# Patient Record
Sex: Female | Born: 1975 | Race: White | Hispanic: No | Marital: Married | Smoking: Never smoker
Health system: Southern US, Community
[De-identification: ages and names within clinical notes are randomized; demographics above are authoritative.]

---

## 2004-07-25 ENCOUNTER — Ambulatory Visit: Payer: Self-pay | Admitting: Family Medicine

## 2007-02-26 ENCOUNTER — Ambulatory Visit (HOSPITAL_COMMUNITY): Admission: RE | Admit: 2007-02-26 | Discharge: 2007-02-26 | Payer: Self-pay | Admitting: Obstetrics and Gynecology

## 2007-02-26 IMAGING — US US OB NUCHAL TRANSLUCENCY 1ST GEST
1 series · 14 of 28 positions shown · non-contrast
Comparison: none

OBSTETRICAL ULTRASOUND:
 This ultrasound was performed in The [HOSPITAL], and the [facility] report will be stored to [REDACTED] PACS.

[Series 1: us ob nuchal translucency 1st gest · 14 of 31 slices shown]
[im 2/31]
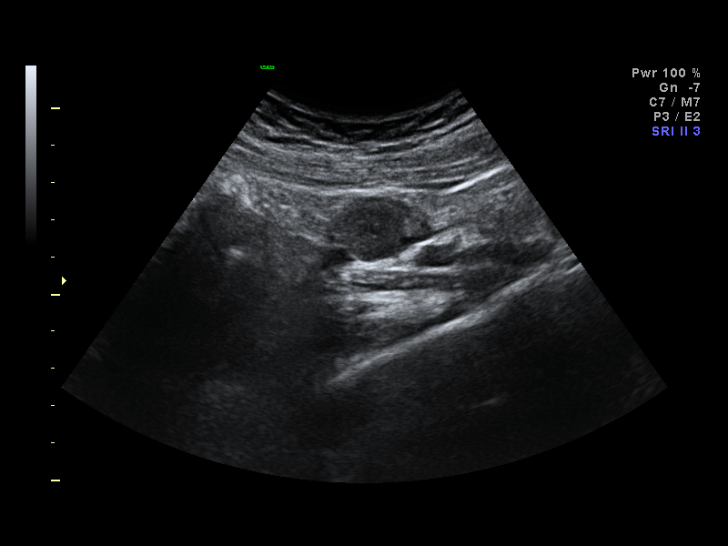
[im 4/31]
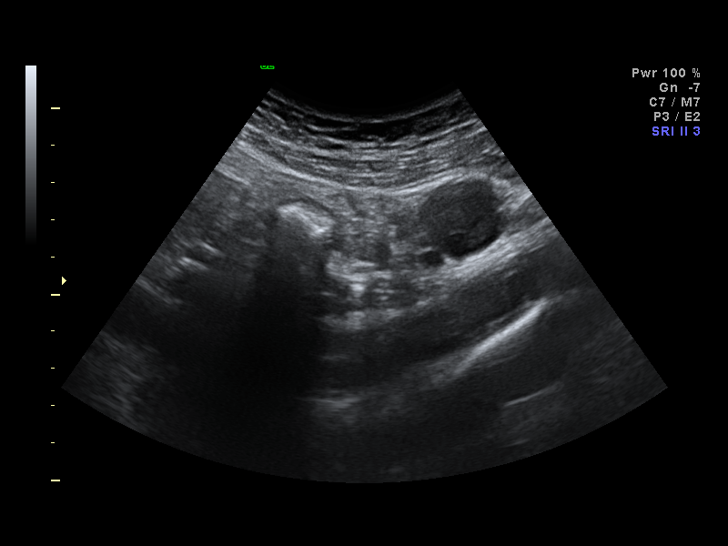
[im 6/31]
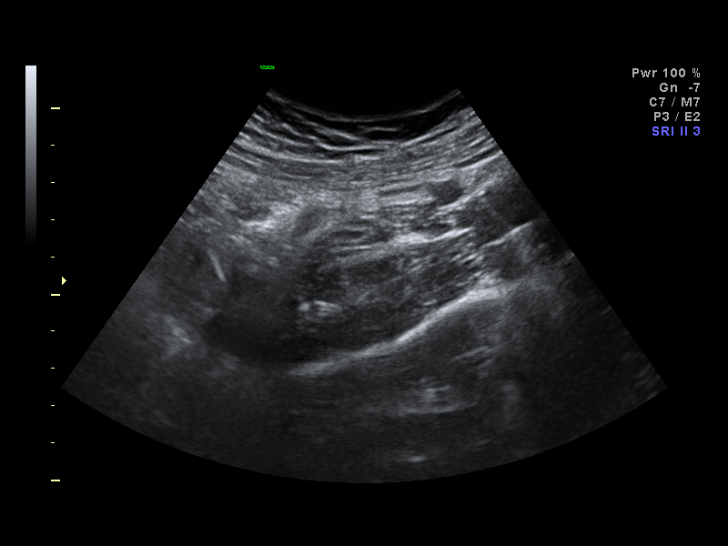
[im 8/31]
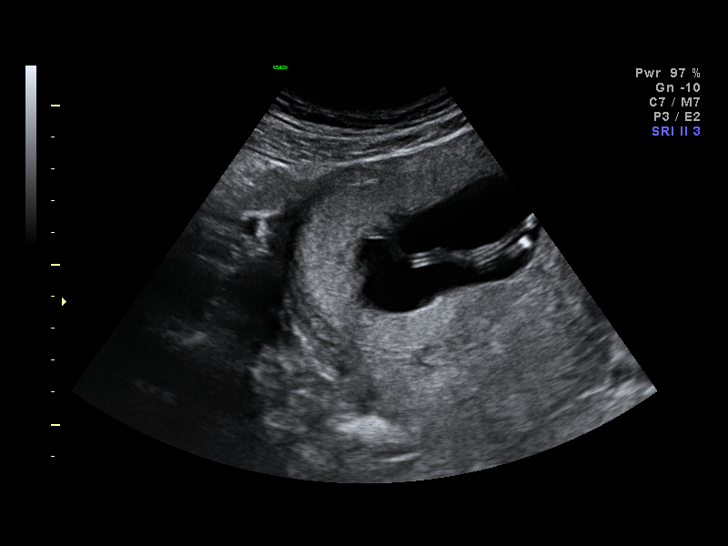
[im 11/31]
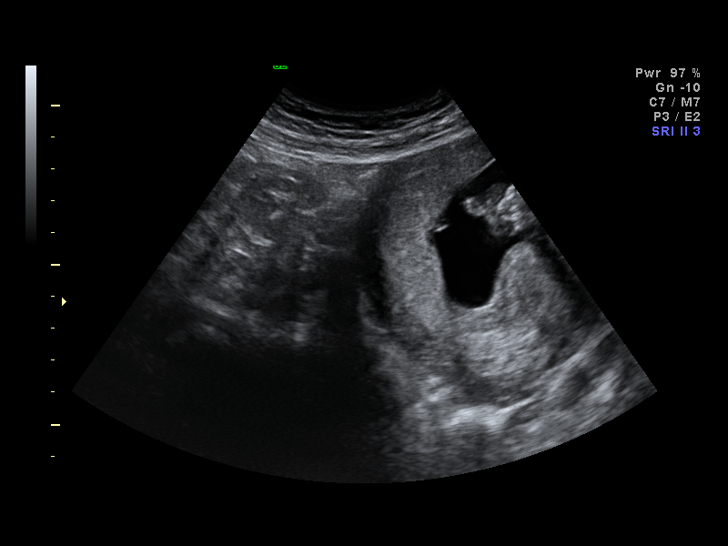
[im 13/31]
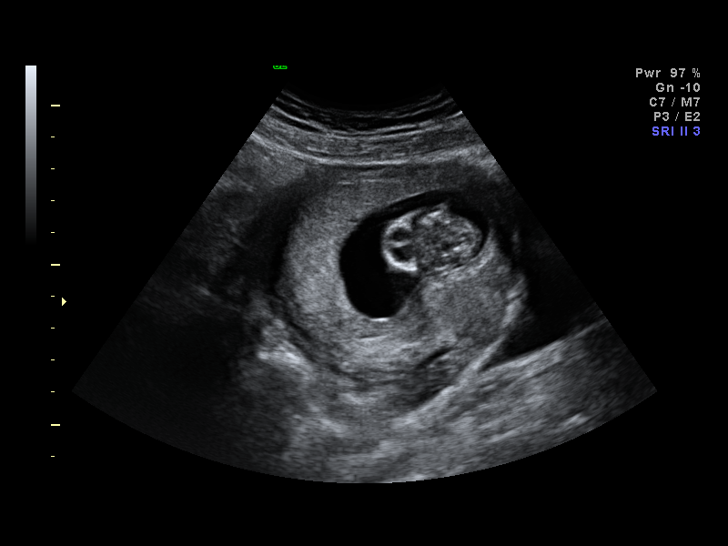
[im 15/31]
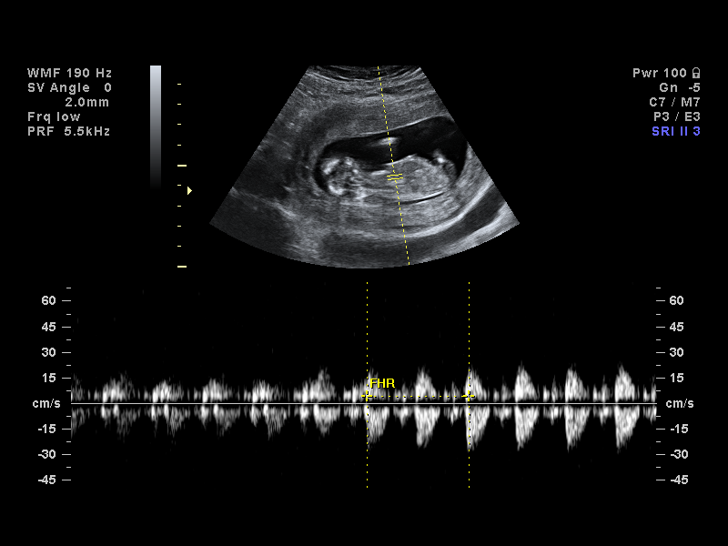
[im 17/31]
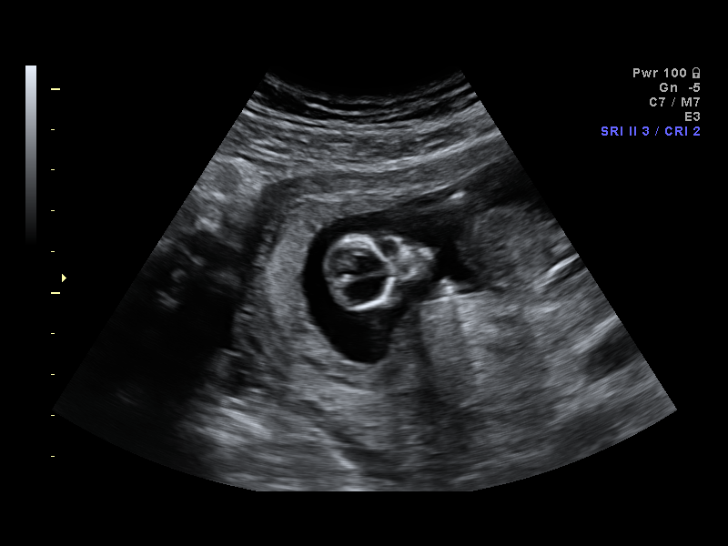
[im 19/31]
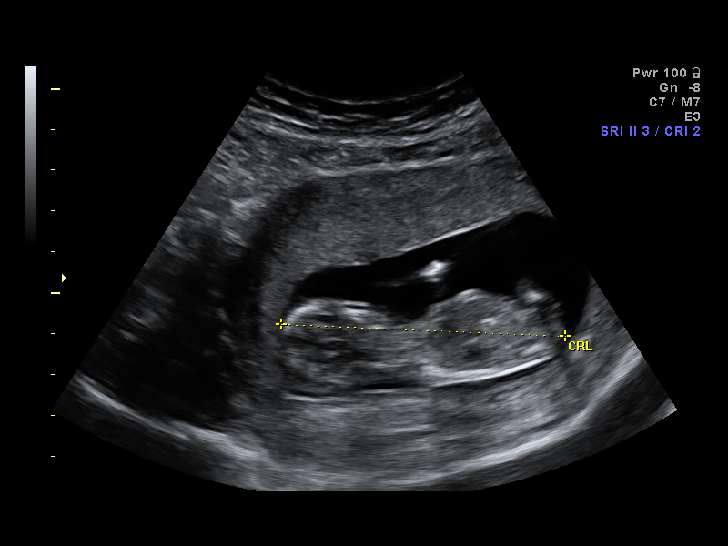
[im 22/31]
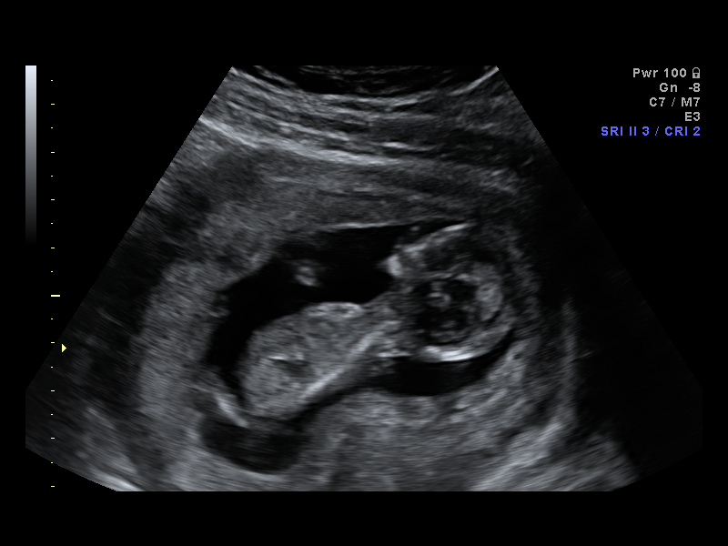
[im 24/31]
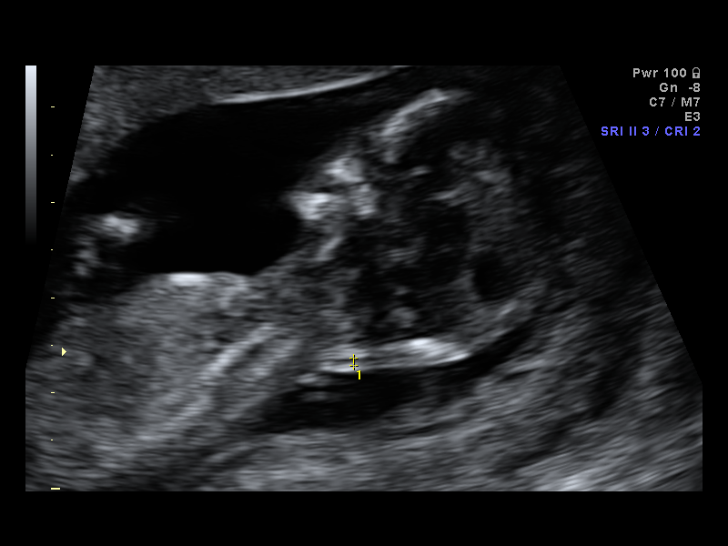
[im 26/31]
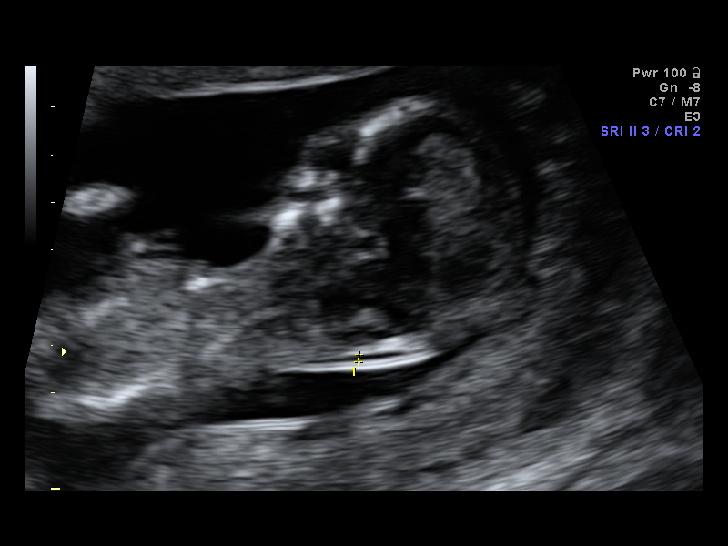
[im 28/31]
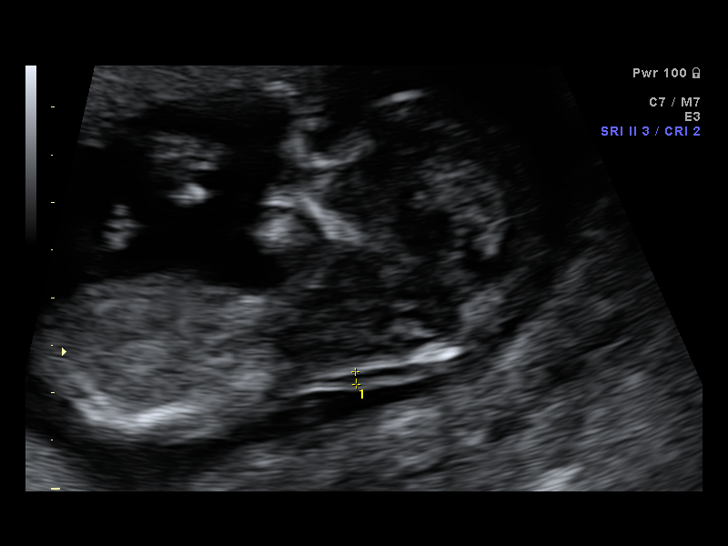
[im 31/31]
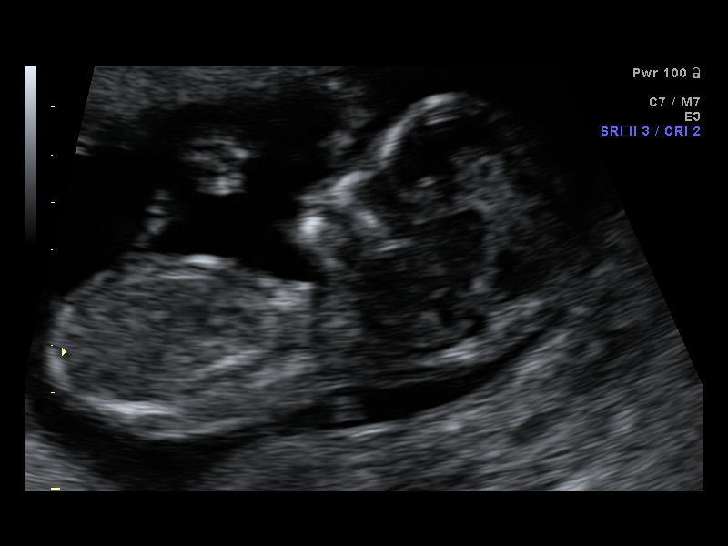

[14 of 28 positions shown; findings below may reference images not displayed]

IMPRESSION: The [facility] report has also been faxed to the ordering physician.

## 2007-03-26 ENCOUNTER — Ambulatory Visit (HOSPITAL_COMMUNITY): Admission: RE | Admit: 2007-03-26 | Discharge: 2007-03-26 | Payer: Self-pay | Admitting: Obstetrics and Gynecology

## 2019-09-16 ENCOUNTER — Other Ambulatory Visit: Payer: Self-pay | Admitting: Obstetrics and Gynecology

## 2019-09-16 DIAGNOSIS — N6489 Other specified disorders of breast: Secondary | ICD-10-CM

## 2019-09-29 ENCOUNTER — Other Ambulatory Visit: Payer: Self-pay | Admitting: Obstetrics and Gynecology

## 2019-09-29 ENCOUNTER — Ambulatory Visit
Admission: RE | Admit: 2019-09-29 | Discharge: 2019-09-29 | Disposition: A | Discharge status: Home / Self Care | Payer: BC Managed Care – PPO | Source: Ambulatory Visit | Attending: Obstetrics and Gynecology | Admitting: Obstetrics and Gynecology

## 2019-09-29 ENCOUNTER — Other Ambulatory Visit: Payer: Self-pay

## 2019-09-29 DIAGNOSIS — N6489 Other specified disorders of breast: Secondary | ICD-10-CM

## 2019-09-29 IMAGING — MG MM DIGITAL DIAGNOSTIC UNILAT*R* W/ TOMO W/ CAD
4 series · 4 of 12 positions shown · non-contrast
Comparison: [DATE] and earlier priors

CLINICAL DATA: 44-year-old patient recalled recent screening
mammogram for evaluation of a possible asymmetry in the inferior
right breast. Patient's mother was diagnosed with breast cancer in
her 40s.

EXAM:
DIGITAL DIAGNOSTIC RIGHT MAMMOGRAM WITH CAD AND TOMO
ULTRASOUND RIGHT BREAST

[R MLO synth-2D]
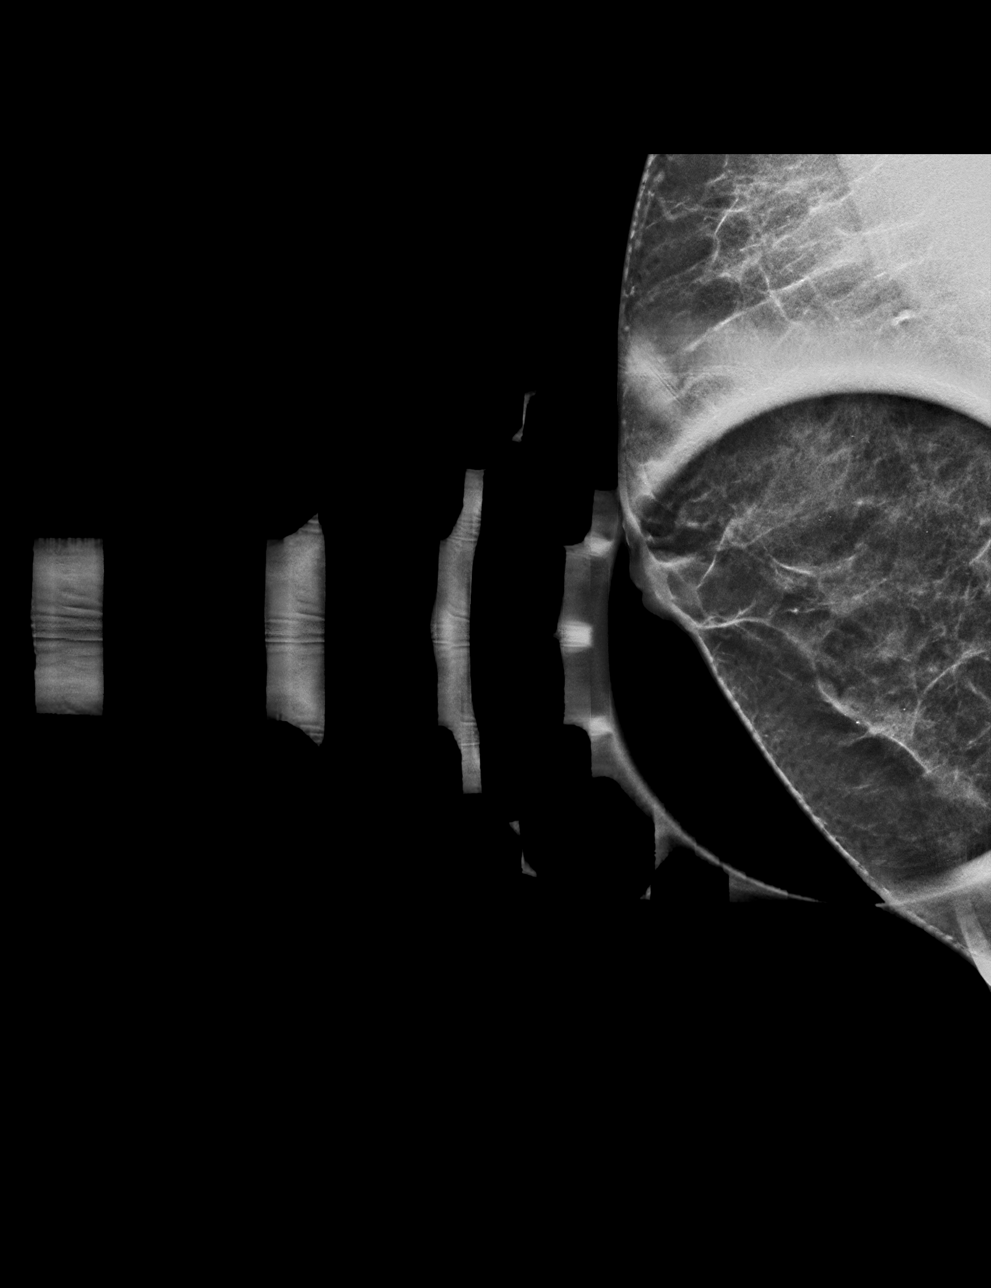

[R ML synth-2D]
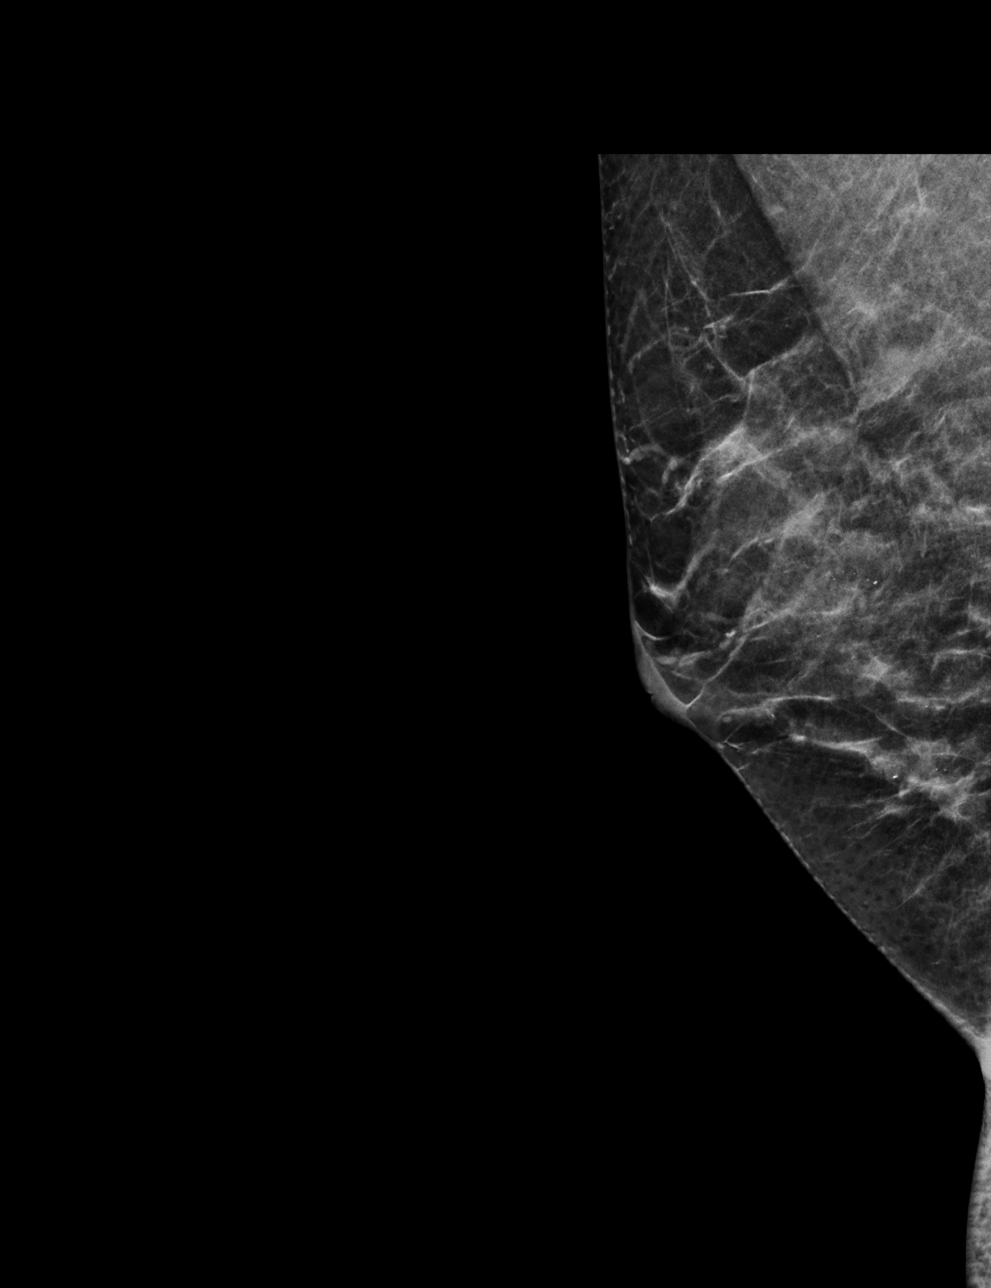

[R ML tomo · tomo slice 22/43.0]
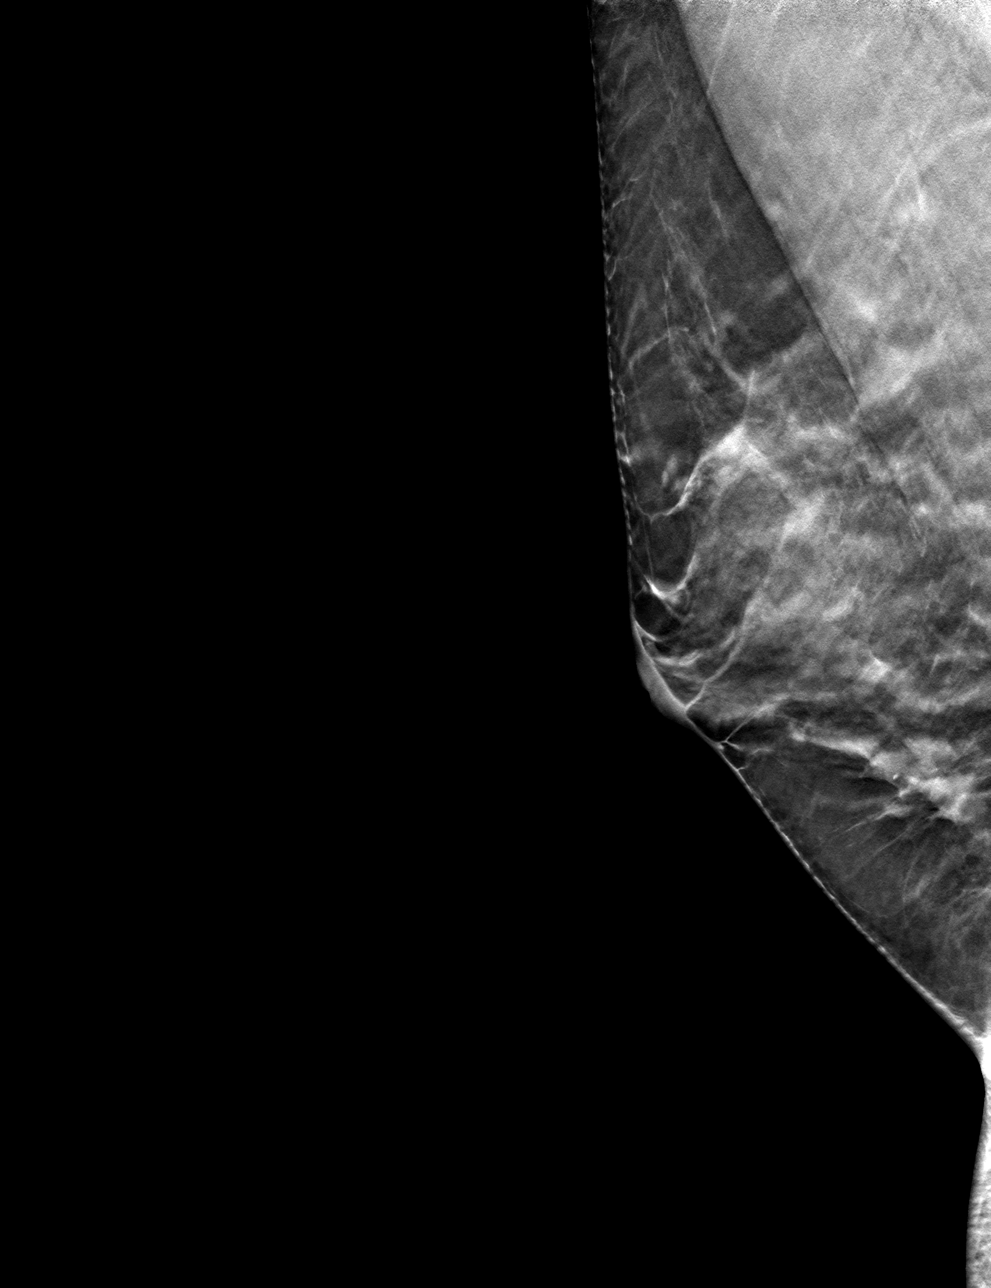

[R MLO tomo · tomo slice 22/43.0]
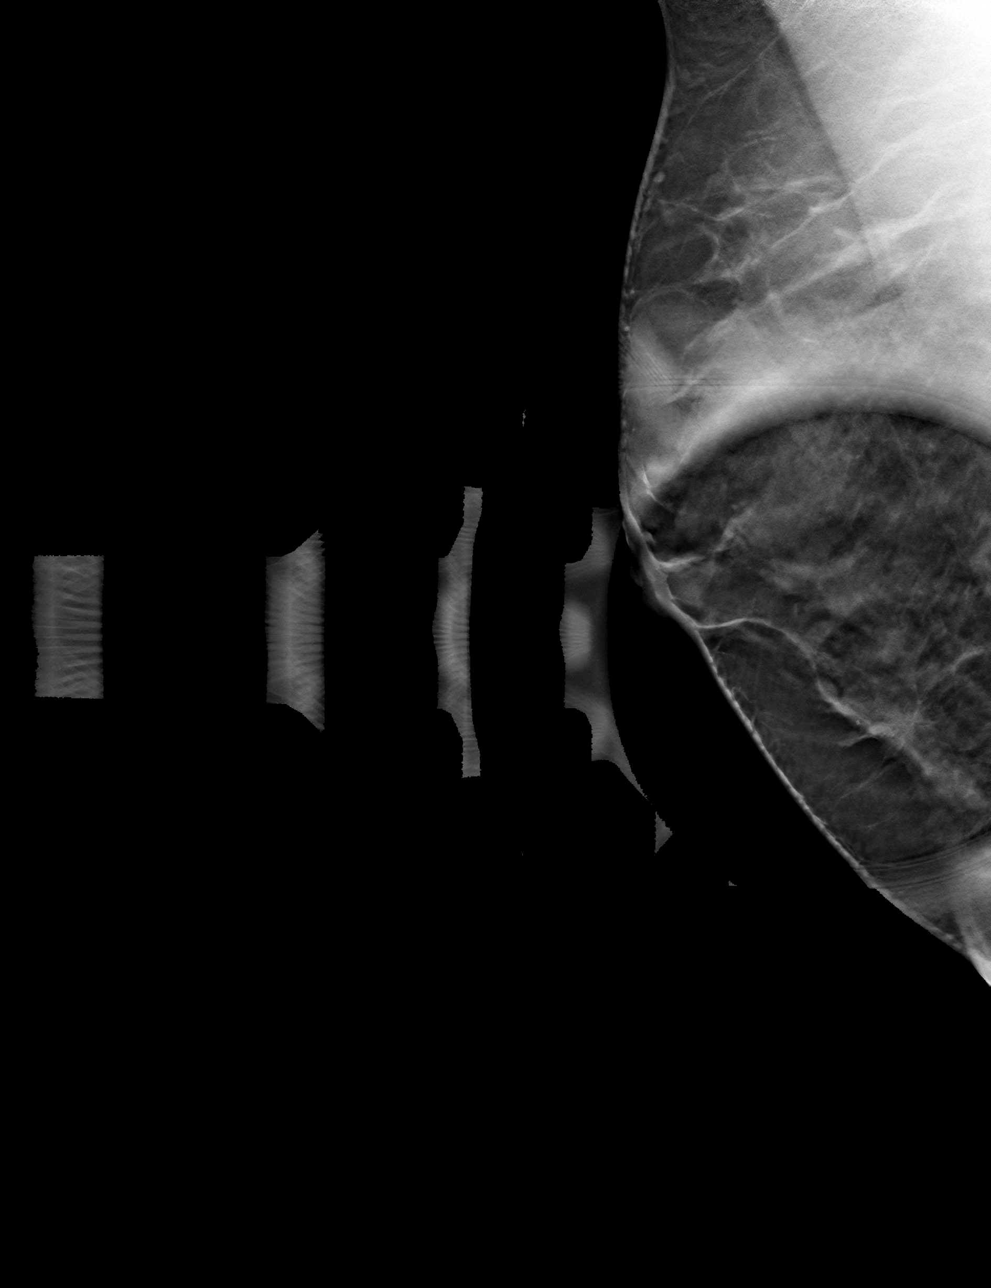

[4 of 12 positions shown; findings below may reference images not displayed]

ACR Breast Density Category c: The breast tissue is heterogeneously
dense, which may obscure small masses.
FINDINGS: Compression views with tomography confirm a persistent asymmetry
with a few associated calcifications in the inferior right breast.
When correlated with the CC view of the recent screening mammogram,
this is thought to be slightly medial in position. 90 degree lateral
view of the right breast shows a persistent asymmetry with
calcifications slightly medial to midline.

Mammographic images were processed with CAD.

On physical exam, no mass is palpated in the lower inner quadrant of
the right breast.

Targeted ultrasound is performed, showing mixed echogenicity mass
with internal cystic spaces, hypoechoic tissue and focal echogenic
areas suggestive of calcifications. This is at 5 o'clock position 1
cm from the nipple and in total measures 2.1 x 0.7 x 1.5 cm.

Ultrasound of the right axilla is negative for lymphadenopathy.
IMPRESSION: 2.1 cm indeterminate mass in the 5 o'clock position of the right
breast. Complex sclerosing lesion or malignancy cannot be completely
excluded.

RECOMMENDATION:
Ultrasound-guided core needle biopsy of the right breast mass is
recommended. Biopsy is being scheduled for the patient.

I have discussed the findings and recommendations with the patient.
If applicable, a reminder letter will be sent to the patient
regarding the next appointment.

BI-RADS CATEGORY  4: Suspicious.

## 2019-09-29 IMAGING — US US BREAST*R* LIMITED INC AXILLA
1 series · 10 of 10 positions shown · non-contrast
Comparison: [DATE] and earlier priors

CLINICAL DATA: 44-year-old patient recalled recent screening
mammogram for evaluation of a possible asymmetry in the inferior
right breast. Patient's mother was diagnosed with breast cancer in
her 40s.

EXAM:
DIGITAL DIAGNOSTIC RIGHT MAMMOGRAM WITH CAD AND TOMO
ULTRASOUND RIGHT BREAST

[Series 2: us breast*right* limited inc axilla · 0.06mm/px · 10 of 10 slices shown]
[im 1/10]
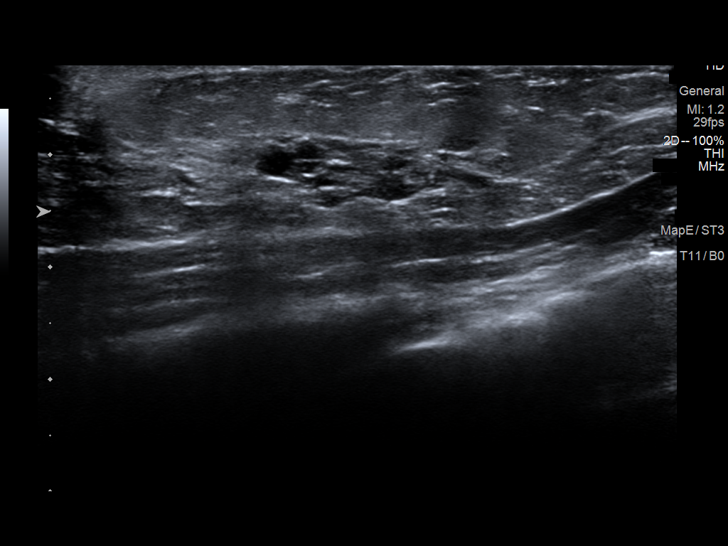
[im 2/10]
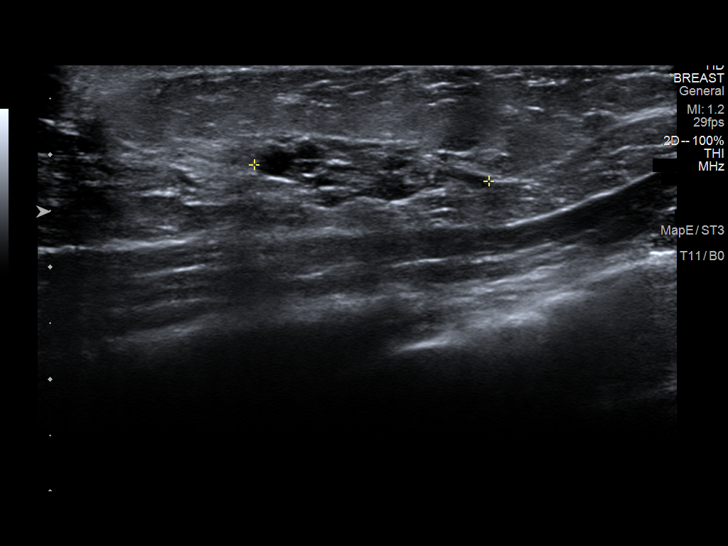
[im 3/10]
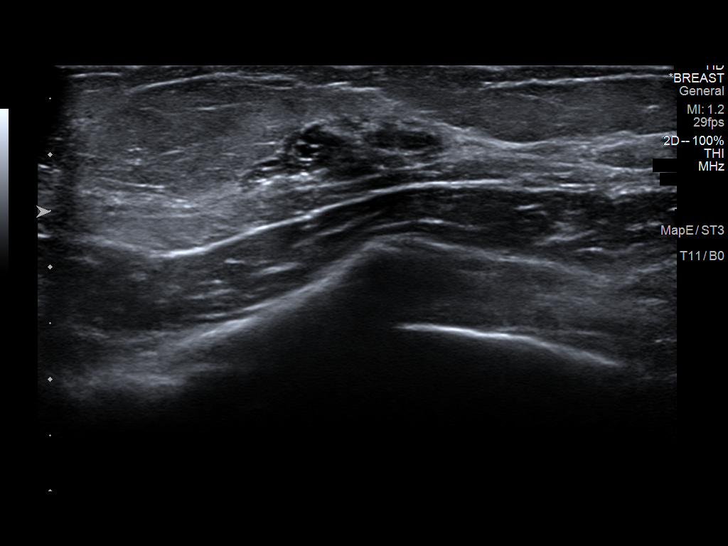
[im 4/10]
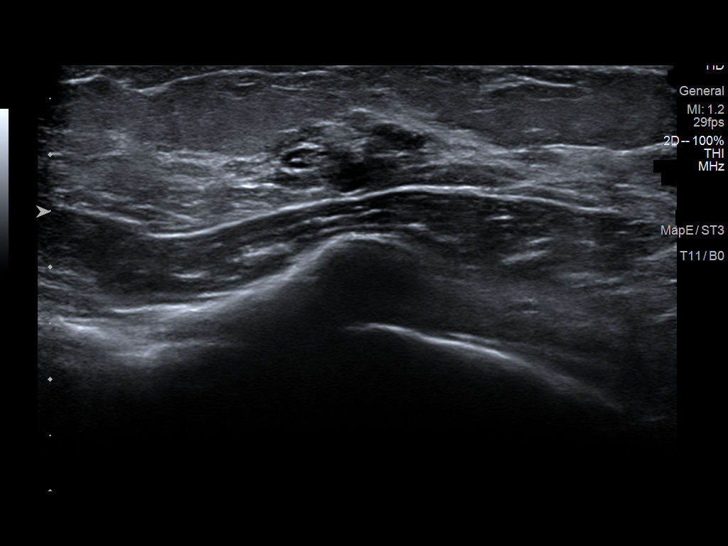
[im 5/10]
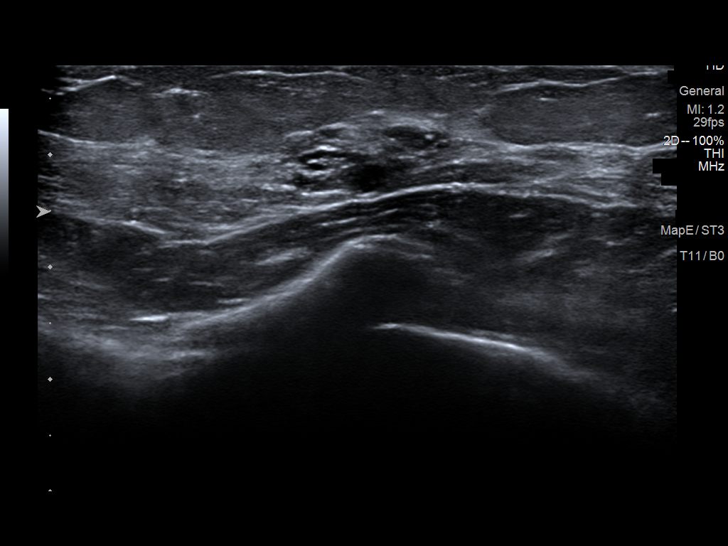
[im 6/10]
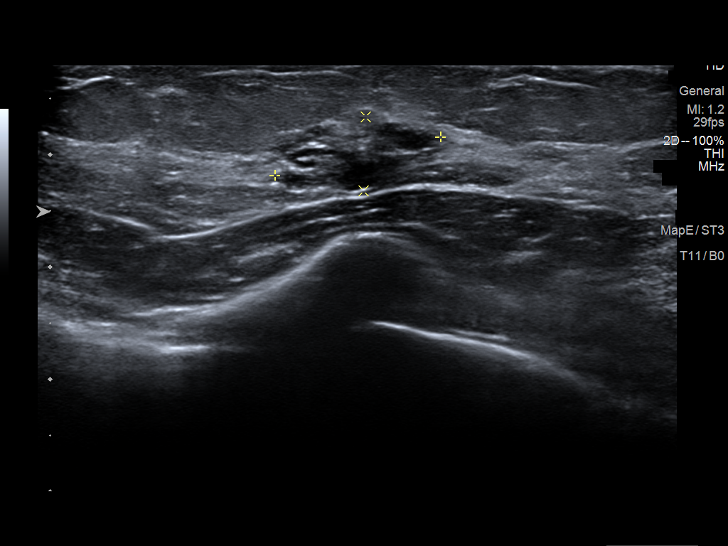
[im 7/10]
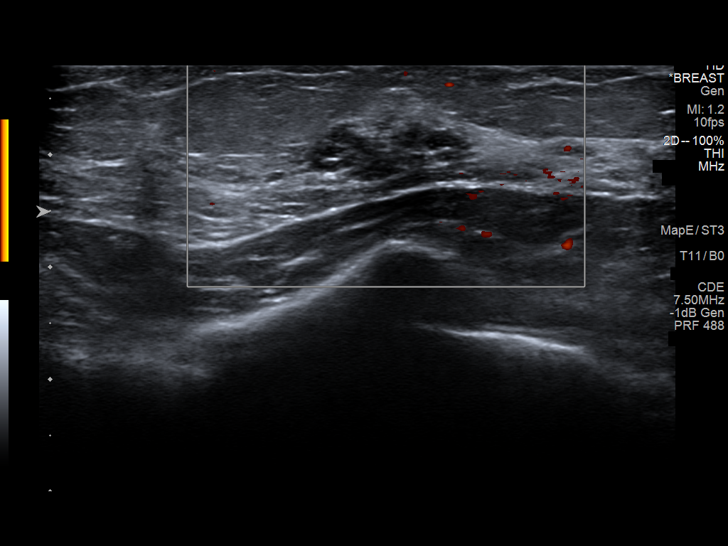
[im 8/10]
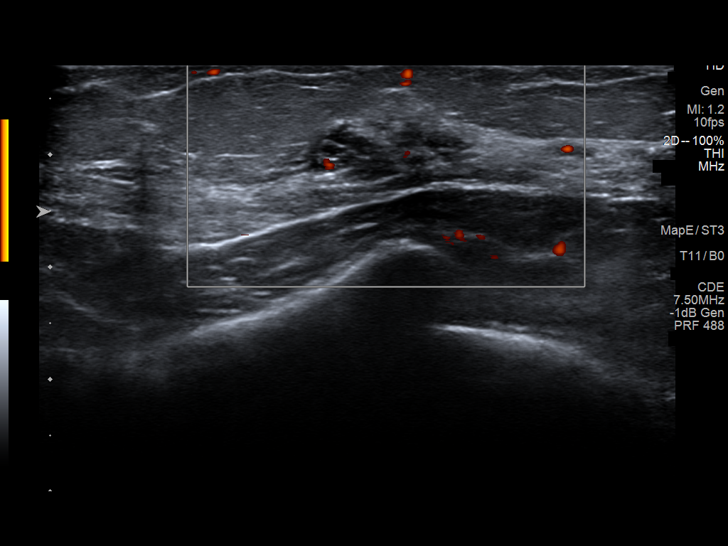
[im 9/10]
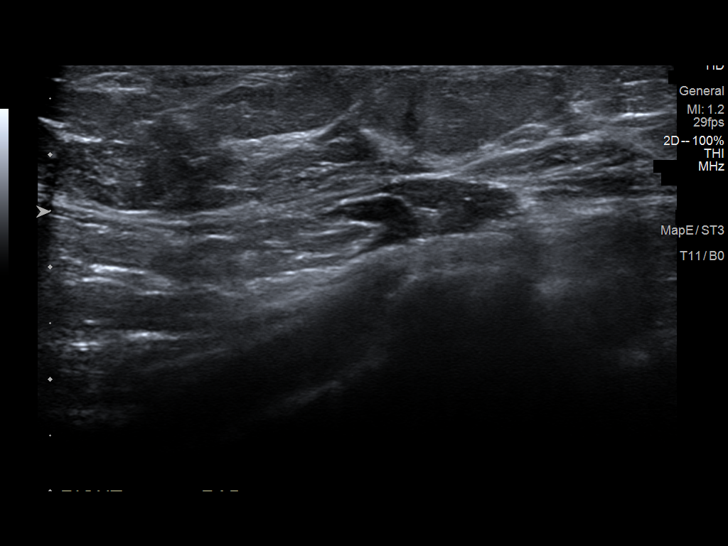
[im 10/10]
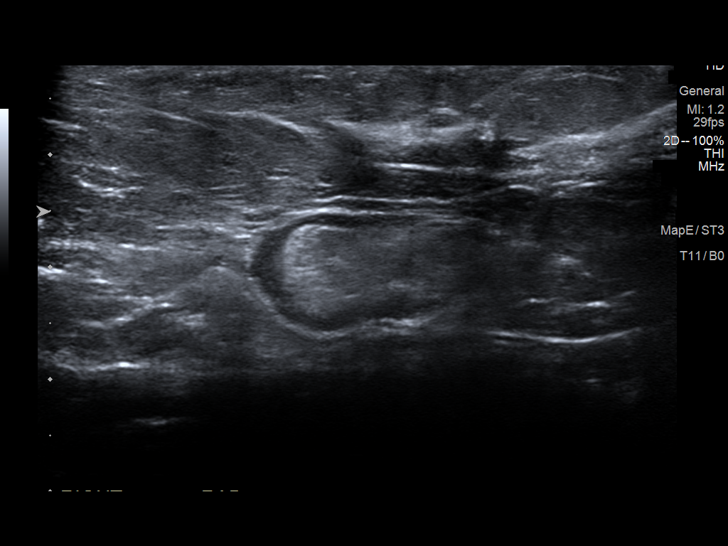

[10 of 10 positions shown; findings below may reference images not displayed]

ACR Breast Density Category c: The breast tissue is heterogeneously
dense, which may obscure small masses.
FINDINGS: Compression views with tomography confirm a persistent asymmetry
with a few associated calcifications in the inferior right breast.
When correlated with the CC view of the recent screening mammogram,
this is thought to be slightly medial in position. 90 degree lateral
view of the right breast shows a persistent asymmetry with
calcifications slightly medial to midline.

Mammographic images were processed with CAD.

On physical exam, no mass is palpated in the lower inner quadrant of
the right breast.

Targeted ultrasound is performed, showing mixed echogenicity mass
with internal cystic spaces, hypoechoic tissue and focal echogenic
areas suggestive of calcifications. This is at 5 o'clock position 1
cm from the nipple and in total measures 2.1 x 0.7 x 1.5 cm.

Ultrasound of the right axilla is negative for lymphadenopathy.
IMPRESSION: 2.1 cm indeterminate mass in the 5 o'clock position of the right
breast. Complex sclerosing lesion or malignancy cannot be completely
excluded.

RECOMMENDATION:
Ultrasound-guided core needle biopsy of the right breast mass is
recommended. Biopsy is being scheduled for the patient.

I have discussed the findings and recommendations with the patient.
If applicable, a reminder letter will be sent to the patient
regarding the next appointment.

BI-RADS CATEGORY  4: Suspicious.

## 2019-10-04 ENCOUNTER — Other Ambulatory Visit: Payer: Self-pay | Admitting: Obstetrics and Gynecology

## 2019-10-04 DIAGNOSIS — N6489 Other specified disorders of breast: Secondary | ICD-10-CM

## 2019-10-10 ENCOUNTER — Other Ambulatory Visit: Payer: Self-pay

## 2019-10-10 ENCOUNTER — Ambulatory Visit
Admission: RE | Admit: 2019-10-10 | Discharge: 2019-10-10 | Disposition: A | Discharge status: Home / Self Care | Payer: BC Managed Care – PPO | Source: Ambulatory Visit | Attending: Obstetrics and Gynecology | Admitting: Obstetrics and Gynecology

## 2019-10-10 DIAGNOSIS — N6489 Other specified disorders of breast: Secondary | ICD-10-CM

## 2019-10-10 IMAGING — US US  BREAST BX W/ LOC DEV 1ST LESION IMG BX SPEC US GUIDE*R*
1 series · 12 of 13 positions shown · non-contrast
Comparison: Previous exam(s).
COMPARISON: Previous exam(s).

Addendum:
CLINICAL DATA: 44-year-old with a screening detected asymmetry
involving the RIGHT breast, shown on diagnostic ultrasound to have
an indeterminate 2.1 cm heterogeneous mass with cystic spaces and
calcifications in the LOWER INNER QUADRANT at 5 o'clock position
approximately 1 cm nipple.

EXAM:
ULTRASOUND GUIDED RIGHT BREAST CORE NEEDLE BIOPSY

[Series 1: us breast bx w/ loc dev 1st lesion img bx spec us  · 0.06mm/px · 12 of 13 slices shown]
[im 1/13]
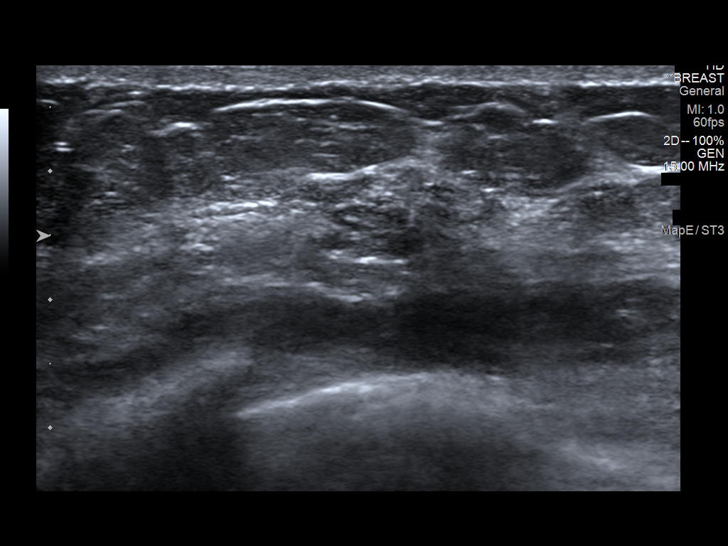
[im 2/13]
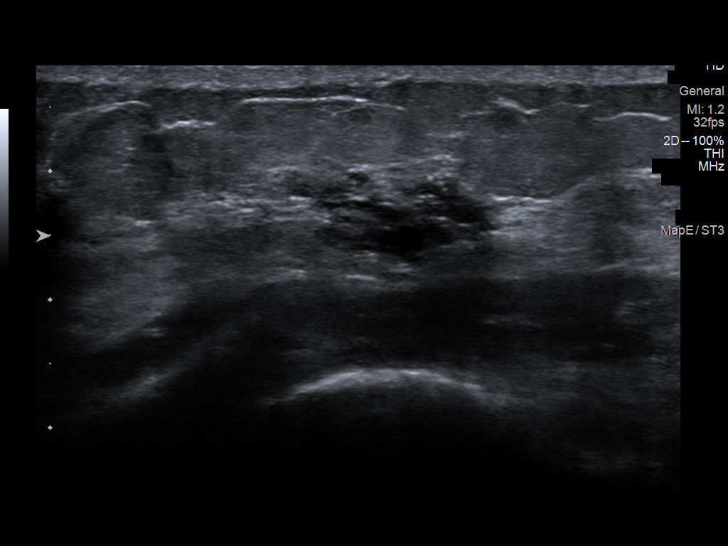
[im 3/13]
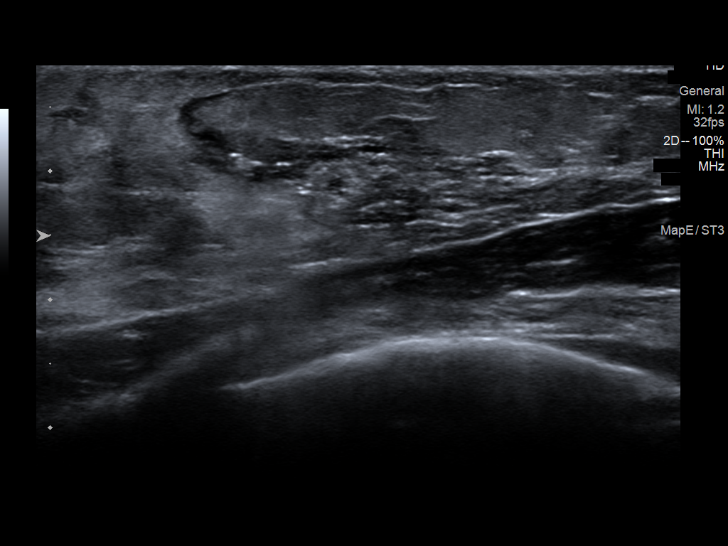
[im 4/13]
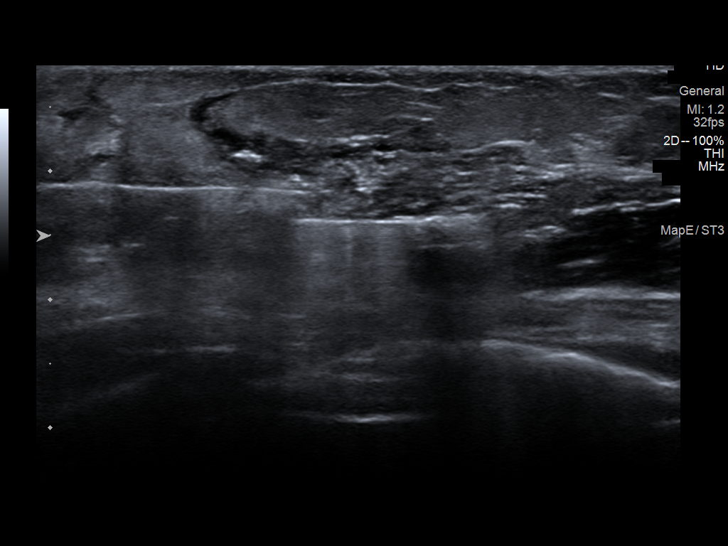
[im 5/13]
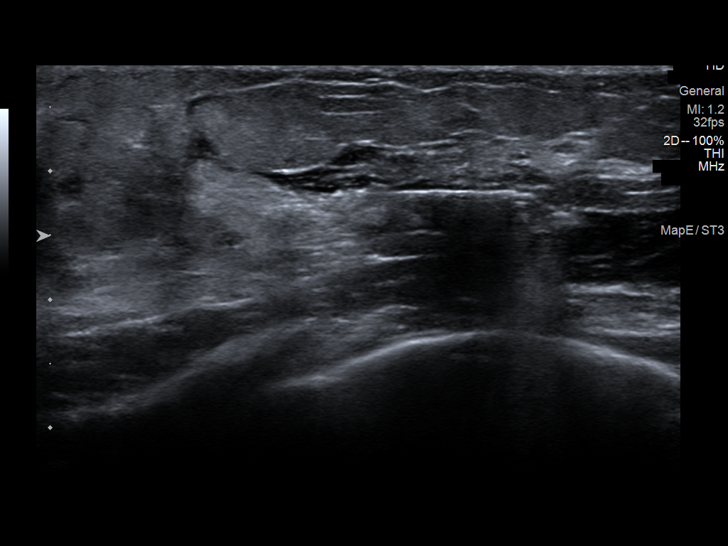
[im 6/13]
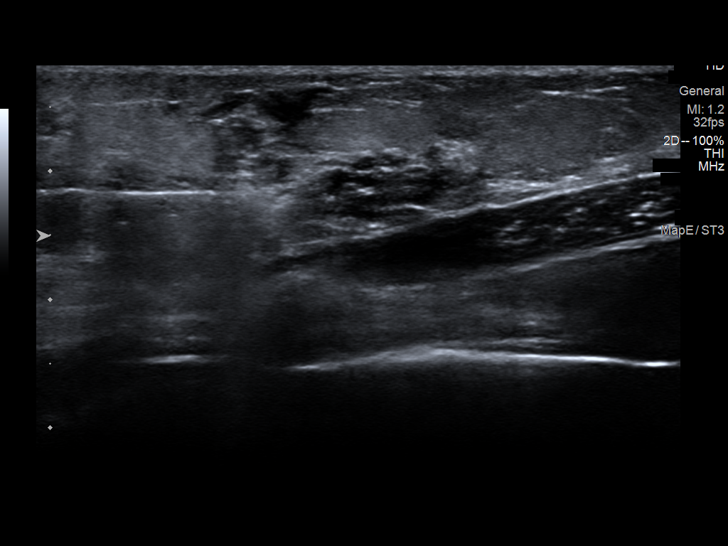
[im 8/13]
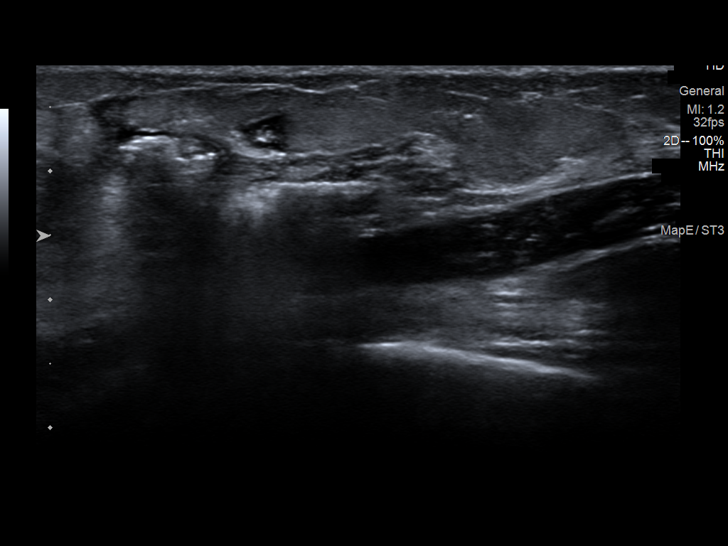
[im 9/13]
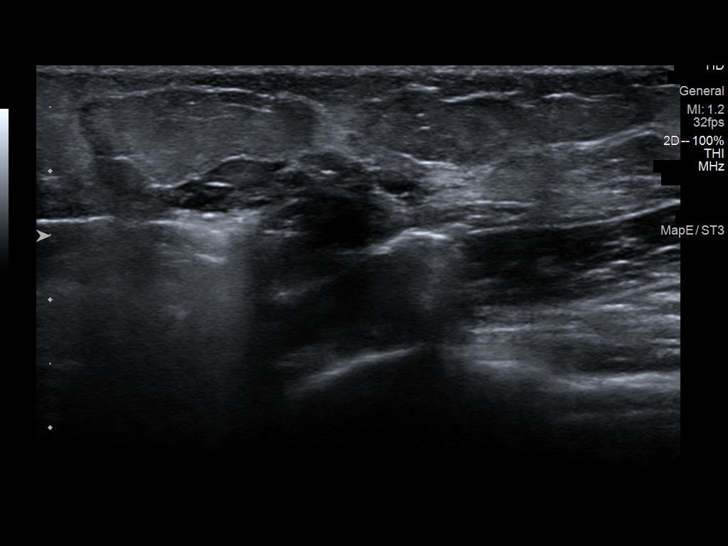
[im 10/13]
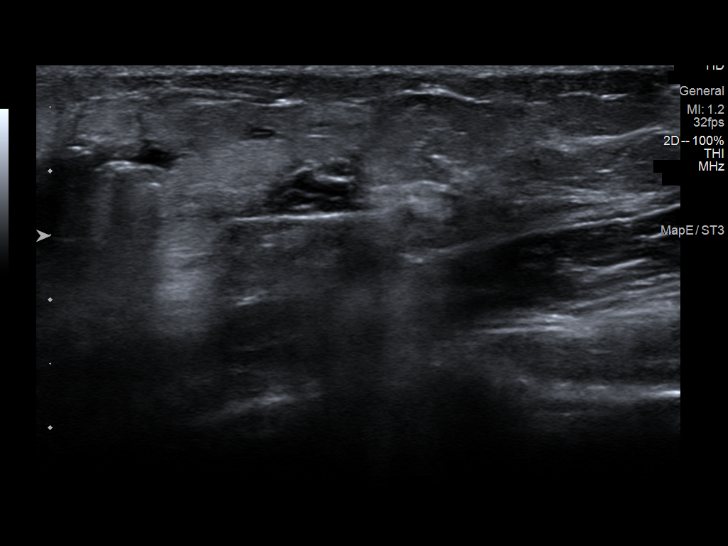
[im 11/13]
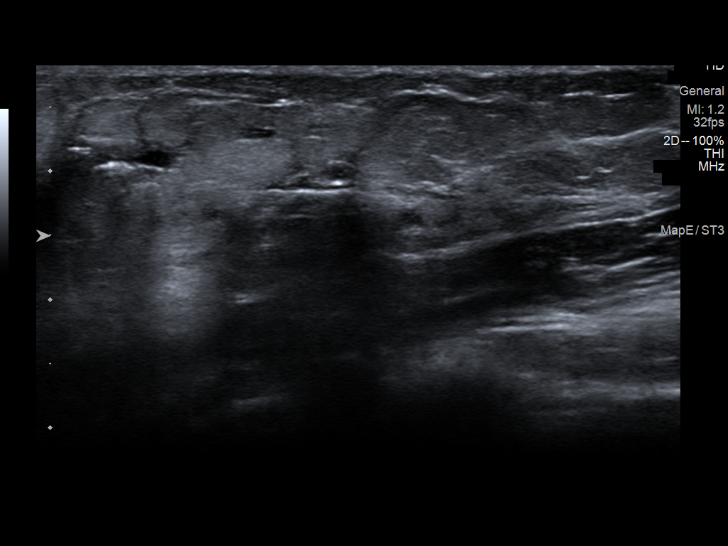
[im 12/13]
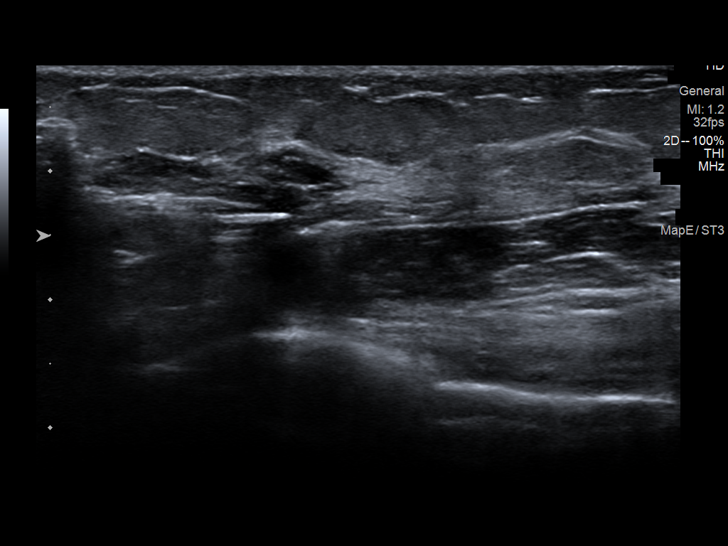
[im 13/13]
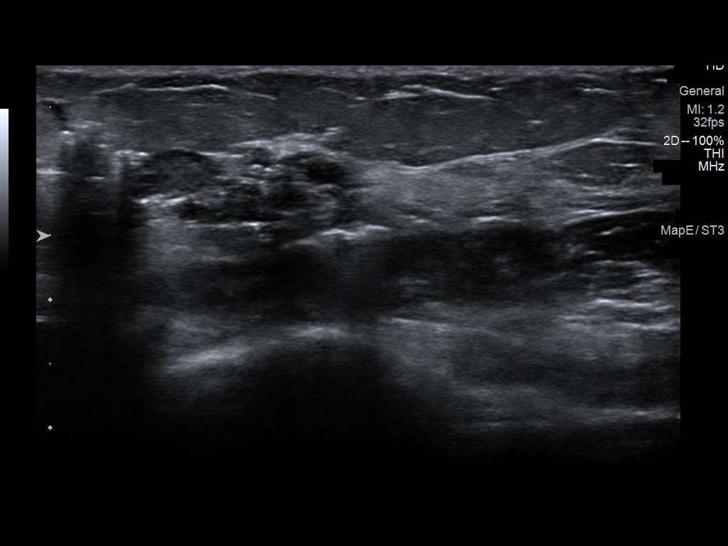

[12 of 13 positions shown; findings below may reference images not displayed]



Lesion quadrant: LOWER INNER QUADRANT.

Using sterile technique with chlorhexidine as skin antisepsis, 1%
lidocaine and 1% lidocaine with epi as local anesthetic, under
direct ultrasound visualization, a 12 gauge LARRY core needle
device placed through an 11 gauge introducer needle was used to
perform biopsy of the mass in the LOWER INNER QUADRANT at 5 o'clock
position approximately 1 cm from nipple using a medial approach. At
the conclusion of the procedure a ribbon shaped tissue marker clip
was deployed into the biopsy cavity.

Follow up 2 view mammogram was performed and dictated separately.
IMPRESSION: Ultrasound guided biopsy of an indeterminate mass involving the
LOWER INNER QUADRANT of the RIGHT breast. No apparent complications.

ADDENDUM:
Pathology revealed COMPLEX SCLEROSING LESION WITH PAPILLOMA of the
RIGHT breast, lower inner quadrant at 5 o'clock, 1 cm from nipple.
This was found to be concordant by Dr. LARRY, with excision
recommended.

Pathology results were discussed with the patient by telephone. The
patient reported doing well after the biopsy with tenderness at the
site. Post biopsy instructions and care were reviewed and questions
were answered. The patient was encouraged to call The [REDACTED]

Surgical consultation has been arranged with Dr. LARRY at
[REDACTED] on [DATE].

Pathology results reported by LARRY RN on [DATE].



Lesion quadrant: LOWER INNER QUADRANT.

Using sterile technique with chlorhexidine as skin antisepsis, 1%
lidocaine and 1% lidocaine with epi as local anesthetic, under
direct ultrasound visualization, a 12 gauge LARRY core needle
device placed through an 11 gauge introducer needle was used to
perform biopsy of the mass in the LOWER INNER QUADRANT at 5 o'clock
position approximately 1 cm from nipple using a medial approach. At
the conclusion of the procedure a ribbon shaped tissue marker clip
was deployed into the biopsy cavity.

Follow up 2 view mammogram was performed and dictated separately.
IMPRESSION: Ultrasound guided biopsy of an indeterminate mass involving the
LOWER INNER QUADRANT of the RIGHT breast. No apparent complications.

## 2019-10-10 IMAGING — MG MM BREAST LOCALIZATION CLIP
4 series · 4 of 12 positions shown · non-contrast
Comparison: Previous exam(s).

CLINICAL DATA: Confirmation of clip placement after
ultrasound-guided core needle biopsy of an indeterminate mass in the
LOWER INNER QUADRANT of the RIGHT breast.

EXAM:
2D AND TOMOSYNTHESIS DIAGNOSTIC RIGHT MAMMOGRAM POST ULTRASOUND
BIOPSY

[R ML synth-2D]
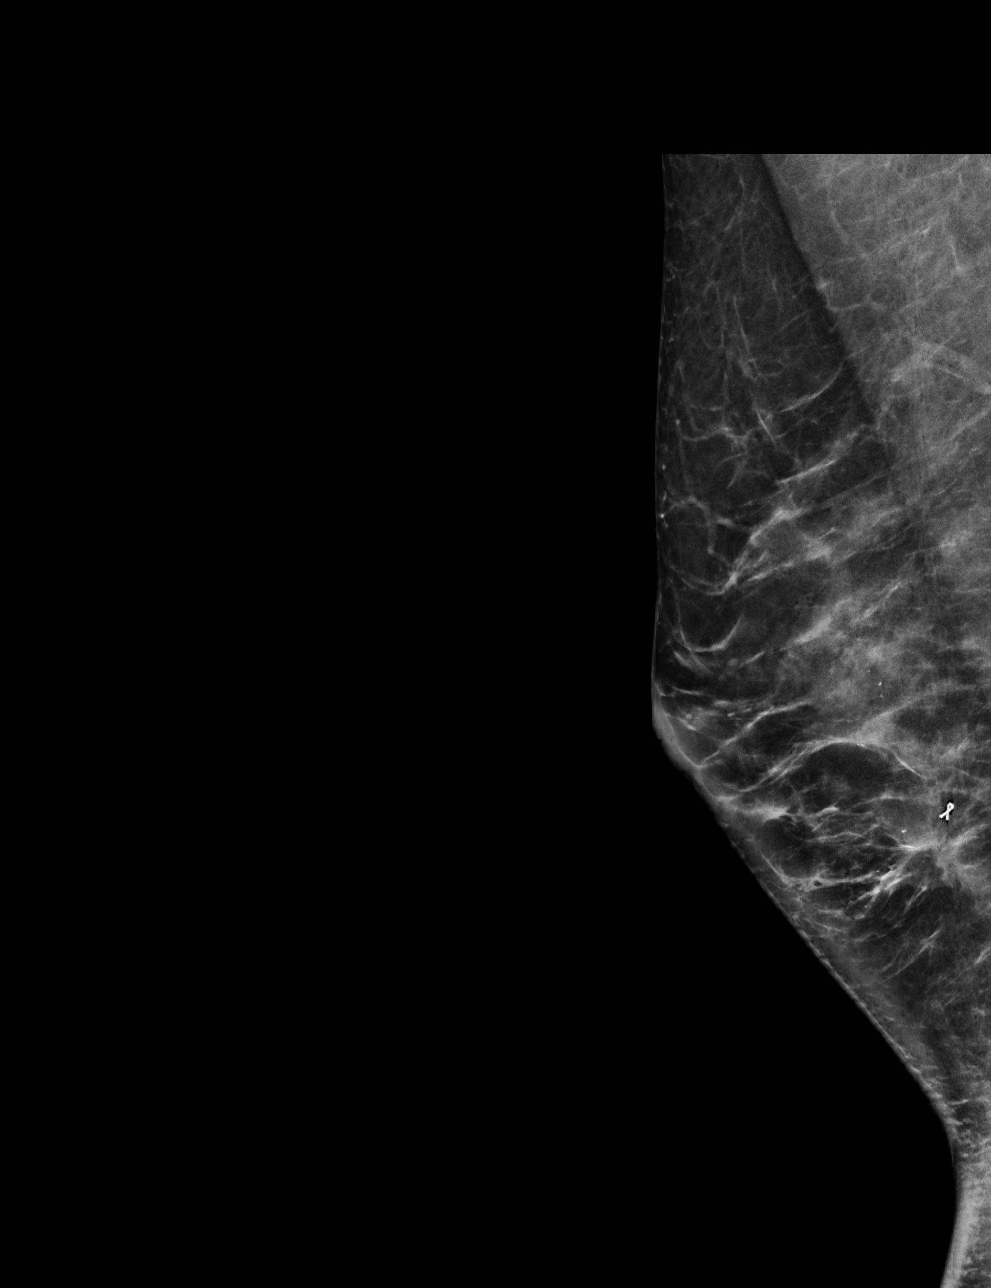

[R CC synth-2D]
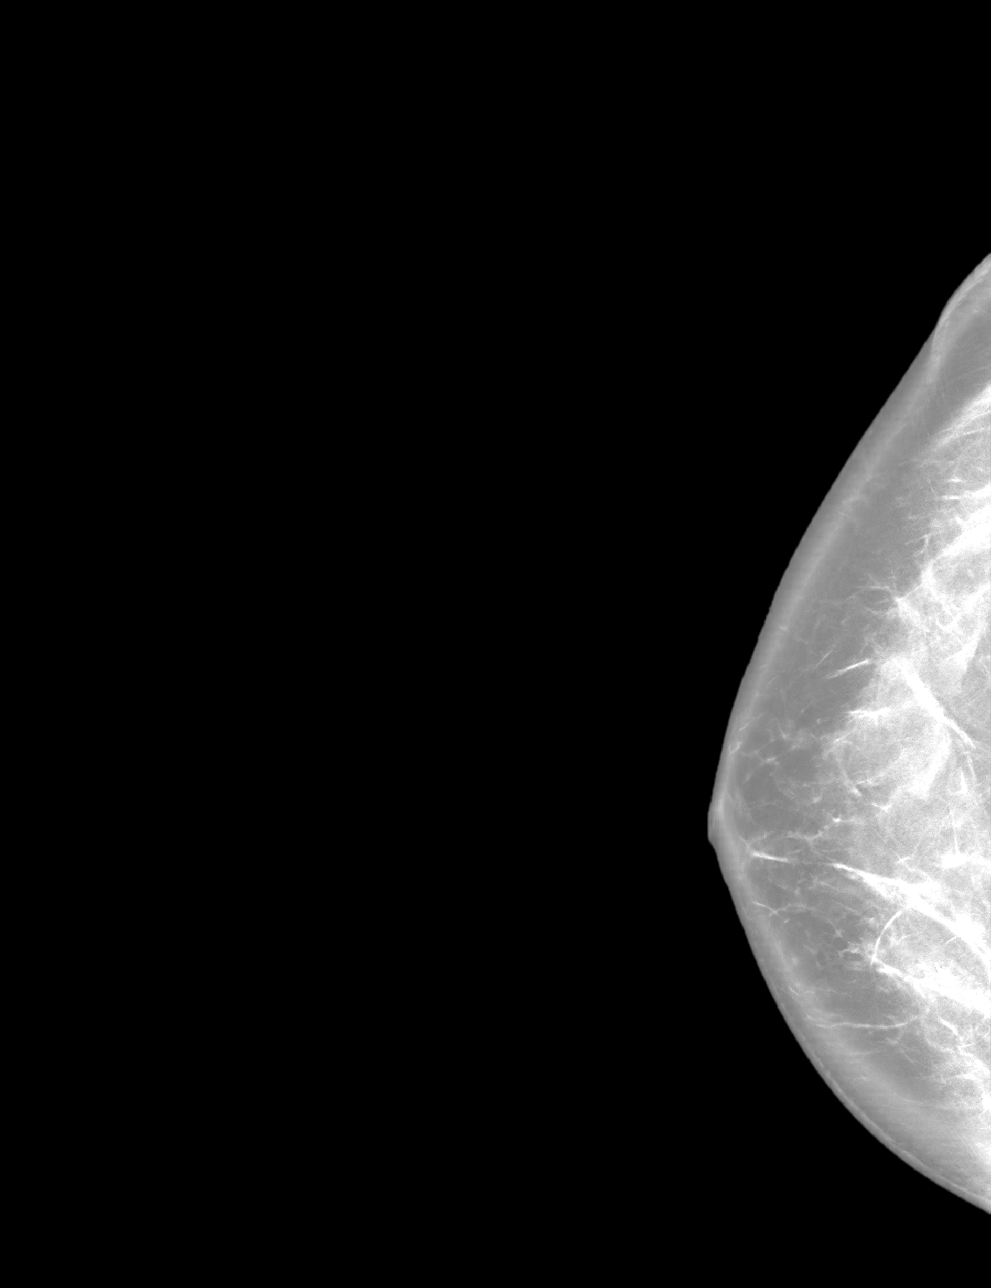

[R ML tomo · tomo slice 36/71.0]
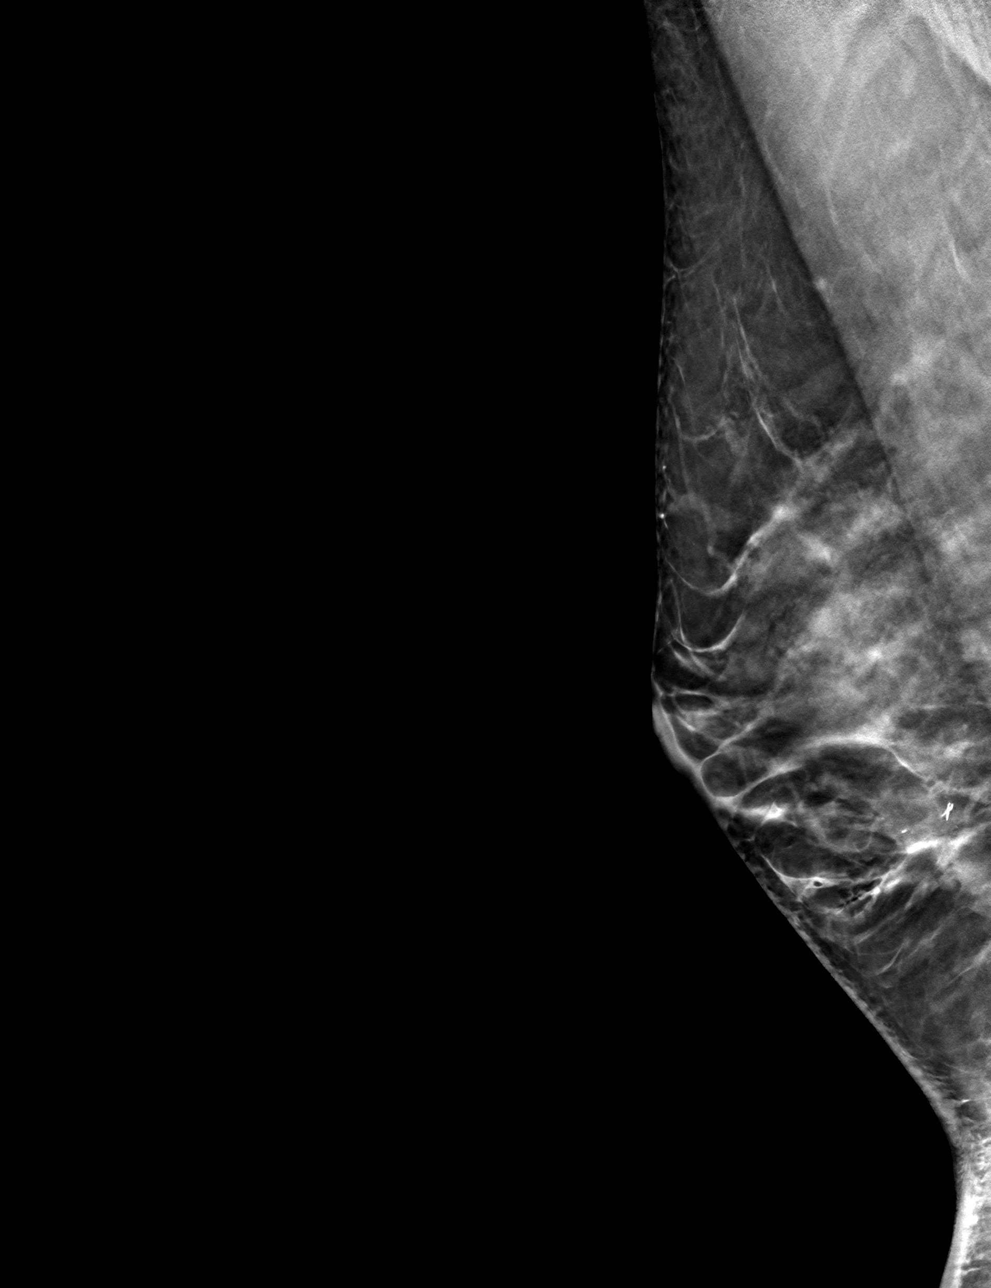

[R CC tomo · tomo slice 46/91.0]
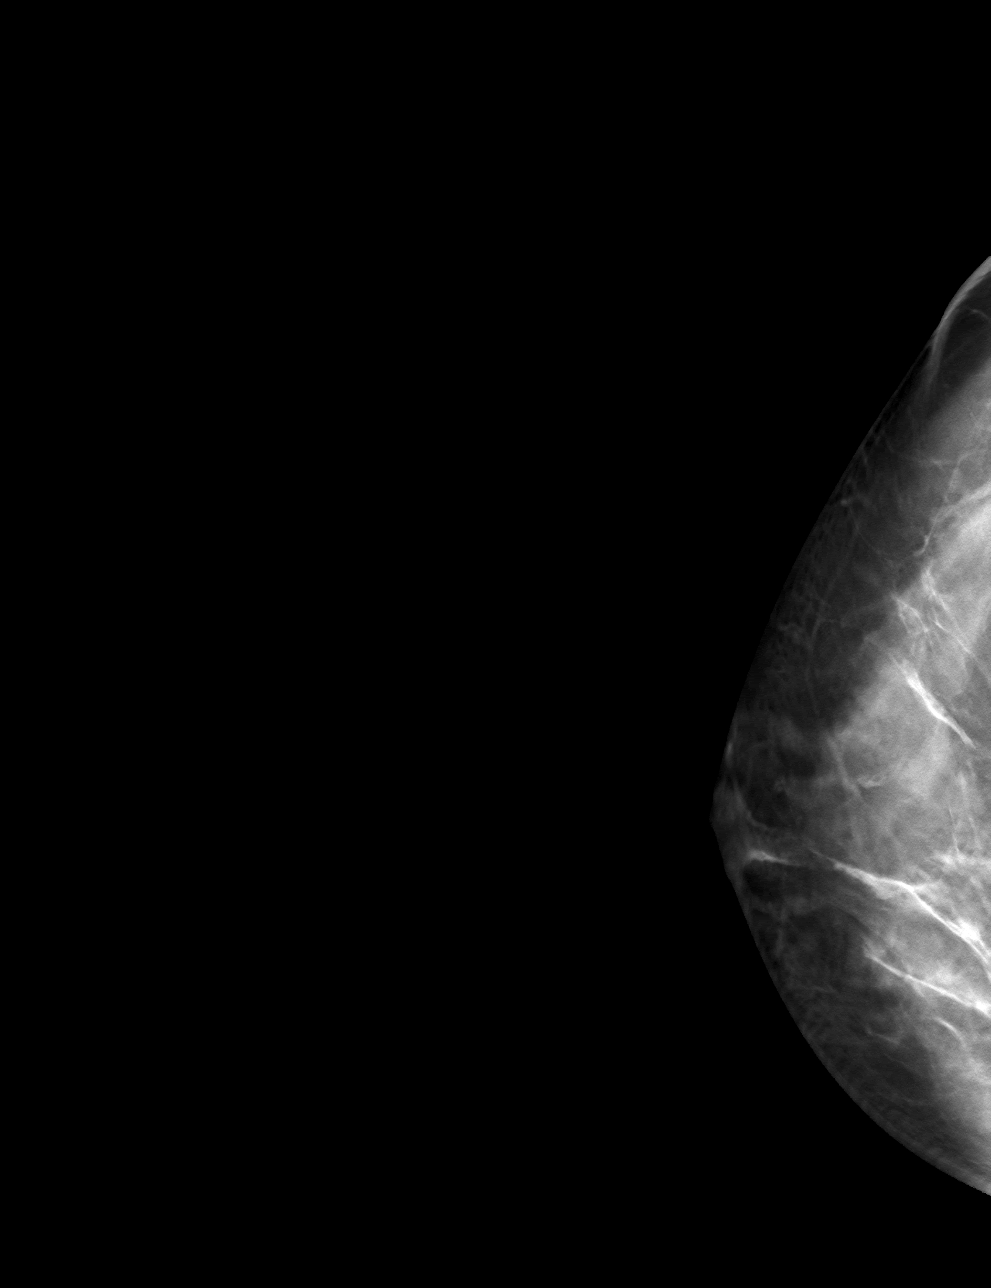

[4 of 12 positions shown; findings below may reference images not displayed]

FINDINGS: Tomosynthesis and synthesized field CC and mediolateral images were
obtained following ultrasound guided biopsy of an indeterminate mass
involving the LOWER INNER QUADRANT of the RIGHT breast. The ribbon
shaped tissue marking clip is appropriately position at the site of
the biopsied mass. Calcifications at the posterior margin of the
mass appear to have been removed with the biopsy.

Expected post biopsy changes are present without evidence of
hematoma.
IMPRESSION: Appropriate positioning of the ribbon shaped tissue marking clip at
the POSTERIOR margin of the biopsied mass in the LOWER INNER
QUADRANT of the RIGHT breast.

Final Assessment: Post Procedure Mammograms for Marker Placement

## 2020-06-05 ENCOUNTER — Encounter: Payer: Self-pay | Admitting: Nurse Practitioner

## 2020-06-05 ENCOUNTER — Ambulatory Visit: Payer: BC Managed Care – PPO | Admitting: Nurse Practitioner

## 2020-06-05 ENCOUNTER — Other Ambulatory Visit: Payer: Self-pay

## 2020-06-05 VITALS — BP 82/58 | HR 65 | Temp 98.2°F | Ht 66.0 in | Wt 165.0 lb

## 2020-06-05 DIAGNOSIS — R300 Dysuria: Secondary | ICD-10-CM | POA: Diagnosis not present

## 2020-06-05 DIAGNOSIS — N3001 Acute cystitis with hematuria: Secondary | ICD-10-CM | POA: Diagnosis not present

## 2020-06-05 DIAGNOSIS — Z8619 Personal history of other infectious and parasitic diseases: Secondary | ICD-10-CM

## 2020-06-05 DIAGNOSIS — R350 Frequency of micturition: Secondary | ICD-10-CM | POA: Diagnosis not present

## 2020-06-05 LAB — POCT URINALYSIS DIPSTICK
Bilirubin, UA: NEGATIVE
Glucose, UA: NEGATIVE
Ketones, UA: NEGATIVE
Nitrite, UA: POSITIVE
Protein, UA: NEGATIVE
Spec Grav, UA: 1.015 (ref 1.010–1.025)
Urobilinogen, UA: NEGATIVE E.U./dL — AB
pH, UA: 7 (ref 5.0–8.0)

## 2020-06-05 MED ORDER — PHENAZOPYRIDINE HCL 200 MG PO TABS: 200.0000 mg | ORAL_TABLET | Freq: Three times a day (TID) | ORAL | 0 refills | Status: DC | PRN

## 2020-06-05 MED ORDER — NITROFURANTOIN MONOHYD MACRO 100 MG PO CAPS: 100.0000 mg | ORAL_CAPSULE | Freq: Two times a day (BID) | ORAL | 0 refills | Status: DC

## 2020-06-05 MED ORDER — FLUCONAZOLE 150 MG PO TABS: 150.0000 mg | ORAL_TABLET | Freq: Once | ORAL | 0 refills | Status: AC

## 2020-06-05 NOTE — Progress Notes (Signed)
Acute Office Visit  Subjective:    Patient ID: Valerie Pennington, female    DOB: 06-29-1975, 45 y.o.   MRN: [id number]  Chief Complaint  Patient presents with  .     Urinary frequency with pain.    HPI Patient is in today for dysuria, frequency, and urgency. Onset of symptoms was 3-days ago. She states she used a bath-bomb recently that she received for her birthday.Treatment has included pushing fluids and baking soda. She denies fever, chills, or flank pain. States last UTI was approximately 10 years ago. She has no pertinent medical or surgical history.  History reviewed. No pertinent past medical history.  History reviewed. No pertinent surgical history.  Family History  Problem Relation Age of Onset  . Breast cancer Mother 38  . Diabetes Father     Social History   Socioeconomic History  . Marital status: Married    Spouse name: Not on file  . Number of children: 2  . Years of education: Not on file  . Highest education level: Not on file  Occupational History  . Occupation: Runner, broadcasting/film/video  Tobacco Use  . Smoking status: Never Smoker  . Smokeless tobacco: Never Used  Vaping Use  . Vaping Use: Never used  Substance and Sexual Activity  . Alcohol use: Never  . Drug use: Never  . Sexual activity: Not on file  Other Topics Concern  . Not on file  Social History Narrative  . Not on file   Social Determinants of Health   Financial Resource Strain: Not on file  Food Insecurity: Not on file  Transportation Needs: Not on file  Physical Activity: Not on file  Stress: Not on file  Social Connections: Not on file  Intimate Partner Violence: Not on file    No outpatient medications prior to visit.   No facility-administered medications prior to visit.    Allergies  Allergen Reactions  . Penicillins Hives  . Sulfa Antibiotics Hives and Rash    hives     Review of Systems  Constitutional: Negative for fatigue and fever.  HENT: Negative for congestion,  ear pain, sinus pressure and sore throat.   Eyes: Negative for pain.  Respiratory: Negative for cough, chest tightness, shortness of breath and wheezing.   Cardiovascular: Negative for chest pain and palpitations.  Gastrointestinal: Negative for abdominal pain, constipation, diarrhea, nausea and vomiting.  Genitourinary: Positive for dysuria, frequency and urgency. Negative for hematuria.  Musculoskeletal: Negative for arthralgias, back pain, joint swelling and myalgias.  Skin: Negative for rash.  Neurological: Negative for dizziness, weakness and headaches.  Psychiatric/Behavioral: Negative for dysphoric mood. The patient is not nervous/anxious.        Objective:    Physical Exam Vitals reviewed.  Constitutional:      Appearance: Normal appearance. She is normal weight.  HENT:     Head: Normocephalic.     Nose: Nose normal.     Mouth/Throat:     Mouth: Mucous membranes are moist.  Cardiovascular:     Rate and Rhythm: Normal rate and regular rhythm.     Pulses: Normal pulses.     Heart sounds: Normal heart sounds.  Pulmonary:     Effort: Pulmonary effort is normal.     Breath sounds: Normal breath sounds.  Abdominal:     General: Bowel sounds are normal.     Palpations: Abdomen is soft.     Tenderness: There is no abdominal tenderness. There is no right CVA tenderness or left  CVA tenderness.  Skin:    General: Skin is warm and dry.     Capillary Refill: Capillary refill takes less than 2 seconds.  Neurological:     General: No focal deficit present.     Mental Status: She is alert and oriented to person, place, and time.  Psychiatric:        Mood and Affect: Mood normal.        Behavior: Behavior normal.     BP (!) 82/58 (BP Location: Left Arm, Patient Position: Sitting)   Pulse 65   Temp 98.2 F (36.8 C) (Temporal)   Ht 5\' 6"  (1.676 m)   Wt 165 lb (74.8 kg)   SpO2 98%   BMI 26.63 kg/m  Wt Readings from Last 3 Encounters:  06/05/20 165 lb (74.8 kg)     Health Maintenance Due  Topic Date Due  . Hepatitis C Screening  Never done  . HIV Screening  Never done  . PAP SMEAR-Modifier  Never done  . INFLUENZA VACCINE  11/20/2019           Assessment & Plan:   1. Acute cystitis with hematuria - Urine Culture - nitrofurantoin, macrocrystal-monohydrate, (MACROBID) 100 MG capsule; Take 1 capsule (100 mg total) by mouth 2 (two) times daily.  Dispense: 14 capsule; Refill: 0 - phenazopyridine (PYRIDIUM) 200 MG tablet; Take 1 tablet (200 mg total) by mouth 3 (three) times daily as needed for pain.  Dispense: 10 tablet; Refill: 0  2. Urinary frequency - POCT urinalysis dipstick  3. History of candidiasis of vagina - fluconazole (DIFLUCAN) 150 MG tablet; Take 1 tablet (150 mg total) by mouth once for 1 dose.  Dispense: 1 tablet; Refill: 0  4. Dysuria - phenazopyridine (PYRIDIUM) 200 MG tablet; Take 1 tablet (200 mg total) by mouth 3 (three) times daily as needed for pain.  Dispense: 10 tablet; Refill: 0  Push fluids, especially water Notify office if symptoms worsen or fail to improve Take Diflucan if yeast infection develops   Follow-up: As needed, if symptoms worsen or fail to improve   Signed,  01/20/2020, DNP

## 2020-06-05 NOTE — Patient Instructions (Addendum)
Push fluids, especially water Notify office if symptoms worsen or fail to improve Take Diflucan if yeast infection develops  Urinary Tract Infection, Adult A urinary tract infection (UTI) is an infection of any part of the urinary tract. The urinary tract includes:  The kidneys.  The ureters.  The bladder.  The urethra. These organs make, store, and get rid of pee (urine) in the body. What are the causes? This infection is caused by germs (bacteria) in your genital area. These germs grow and cause swelling (inflammation) of your urinary tract. What increases the risk? The following factors may make you more likely to develop this condition:  Using a small, thin tube (catheter) to drain pee.  Not being able to control when you pee or poop (incontinence).  Being female. If you are female, these things can increase the risk: ? Using these methods to prevent pregnancy:  A medicine that kills sperm (spermicide).  A device that blocks sperm (diaphragm). ? Having low levels of a female hormone (estrogen). ? Being pregnant. You are more likely to develop this condition if:  You have genes that add to your risk.  You are sexually active.  You take antibiotic medicines.  You have trouble peeing because of: ? A prostate that is bigger than normal, if you are female. ? A blockage in the part of your body that drains pee from the bladder. ? A kidney stone. ? A nerve condition that affects your bladder. ? Not getting enough to drink. ? Not peeing often enough.  You have other conditions, such as: ? Diabetes. ? A weak disease-fighting system (immune system). ? Sickle cell disease. ? Gout. ? Injury of the spine. What are the signs or symptoms? Symptoms of this condition include:  Needing to pee right away.  Peeing small amounts often.  Pain or burning when peeing.  Blood in the pee.  Pee that smells bad or not like normal.  Trouble peeing.  Pee that is  cloudy.  Fluid coming from the vagina, if you are female.  Pain in the belly or lower back. Other symptoms include:  Vomiting.  Not feeling hungry.  Feeling mixed up (confused). This may be the first symptom in older adults.  Being tired and grouchy (irritable).  A fever.  Watery poop (diarrhea). How is this treated?  Taking antibiotic medicine.  Taking other medicines.  Drinking enough water. In some cases, you may need to see a specialist. Follow these instructions at home: Medicines  Take over-the-counter and prescription medicines only as told by your doctor.  If you were prescribed an antibiotic medicine, take it as told by your doctor. Do not stop taking it even if you start to feel better. General instructions  Make sure you: ? Pee until your bladder is empty. ? Do not hold pee for a long time. ? Empty your bladder after sex. ? Wipe from front to back after peeing or pooping if you are a female. Use each tissue one time when you wipe.  Drink enough fluid to keep your pee pale yellow.  Keep all follow-up visits.   Contact a doctor if:  You do not get better after 1-2 days.  Your symptoms go away and then come back. Get help right away if:  You have very bad back pain.  You have very bad pain in your lower belly.  You have a fever.  You have chills.  You feeling like you will vomit or you vomit. Summary  A  urinary tract infection (UTI) is an infection of any part of the urinary tract.  This condition is caused by germs in your genital area.  There are many risk factors for a UTI.  Treatment includes antibiotic medicines.  Drink enough fluid to keep your pee pale yellow. This information is not intended to replace advice given to you by your health care provider. Make sure you discuss any questions you have with your health care provider. Document Revised: 11/18/2019 Document Reviewed: 11/18/2019 Elsevier Patient Education  2021 Elsevier  Inc. Fluconazole tablets What is this medicine? FLUCONAZOLE (floo KON na zole) is an antifungal medicine. It is used to treat certain kinds of fungal or yeast infections. This medicine may be used for other purposes; ask your health care provider or pharmacist if you have questions. COMMON BRAND NAME(S): Diflucan What should I tell my health care provider before I take this medicine? They need to know if you have any of these conditions:  irregular heartbeat or rhythm  kidney disease  liver disease  low levels of potassium in the blood  an unusual or allergic reaction to fluconazole, other azole antifungals, medicines, foods, dyes, or preservatives  pregnant or trying to get pregnant  breast-feeding How should I use this medicine? Take this medicine by mouth. Follow the directions on the prescription label. Do not take your medicine more often than directed. Talk to your pediatrician regarding the use of this medicine in children. Special care may be needed. This medicine has been used in children as young as 38 months of age. Overdosage: If you think you have taken too much of this medicine contact a poison control center or emergency room at once. NOTE: This medicine is only for you. Do not share this medicine with others. What if I miss a dose? If you miss a dose, take it as soon as you can. If it is almost time for your next dose, take only that dose. Do not take double or extra doses. What may interact with this medicine? Do not take this medicine with any of the following medications:  flibanserin  lomitapide  lonafarnib  other medicines that prolong the QT interval (cause an abnormal heart rhythm)  triazolam This medicine may also interact with the following medications:  certain antibiotics like rifabutin, rifampin  certain antivirals for HIV or hepatitis  certain medicines for blood pressure, heart disease, irregular heartbeat  certain medicines for  cholesterol like atorvastatin, lovastatin, and simvastatin  certain medicines for depression, like amitriptyline, nortriptyline  certain medicines for diabetes like glipizide or glyburide  certain medicines for seizures like carbamazepine, phenytoin  certain medicines that treat or prevent blood clots like warfarin  certain narcotic medicines for pain like alfentanil, fentanyl, methadone  cyclophosphamide  cyclosporine  ibrutinib  lemborexant  midazolam  NSAIDS, medicines for pain and inflammation, like ibuprofen or naproxen  olaparib  sirolimus  steroid medicines like prednisone  tacrolimus  theophylline  tofacitinib  tolvaptan  vinblastine  vincristine  vitamin A  voriconazole This list may not describe all possible interactions. Give your health care provider a list of all the medicines, herbs, non-prescription drugs, or dietary supplements you use. Also tell them if you smoke, drink alcohol, or use illegal drugs. Some items may interact with your medicine. What should I watch for while using this medicine? Visit your doctor or health care professional for regular checkups. If you are taking this medicine for a long time you may need blood work. Tell your doctor if  your symptoms do not improve. Some fungal infections need many weeks or months of treatment to cure. Alcohol can increase possible damage to your liver. Avoid alcoholic drinks. If you have a vaginal infection, do not have sex until you have finished your treatment. You can wear a sanitary napkin. Do not use tampons. Wear freshly washed cotton, not synthetic, panties. What side effects may I notice from receiving this medicine? Side effects that you should report to your doctor or health care professional as soon as possible:  allergic reactions like skin rash or itching, hives, swelling of the lips, mouth, tongue, or throat  dark urine  feeling dizzy or faint  irregular heartbeat or chest  pain  redness, blistering, peeling or loosening of the skin, including inside the mouth  trouble breathing  unusual bruising or bleeding  vomiting  yellowing of the eyes or skin Side effects that usually do not require medical attention (report to your doctor or health care professional if they continue or are bothersome):  changes in how food tastes  diarrhea  headache  stomach upset or nausea This list may not describe all possible side effects. Call your doctor for medical advice about side effects. You may report side effects to FDA at 1-800-FDA-1088. Where should I keep my medicine? Keep out of the reach of children. Store at room temperature below 30 degrees C (86 degrees F). Throw away any medicine after the expiration date. NOTE: This sheet is a summary. It may not cover all possible information. If you have questions about this medicine, talk to your doctor, pharmacist, or health care provider.  2021 Elsevier/Gold Standard (2020-02-15 08:51:42) Nitrofurantoin tablets or capsules What is this medicine? NITROFURANTOIN (nye troe fyoor AN toyn) is an antibiotic. It is used to treat urinary tract infections. This medicine may be used for other purposes; ask your health care provider or pharmacist if you have questions. COMMON BRAND NAME(S): Macrobid, Macrodantin, Urotoin What should I tell my health care provider before I take this medicine? They need to know if you have any of these conditions:  anemia  diabetes  glucose-6-phosphate dehydrogenase deficiency  kidney disease  liver disease  lung disease  other chronic illness  an unusual or allergic reaction to nitrofurantoin, other antibiotics, other medicines, foods, dyes or preservatives  pregnant or trying to get pregnant  breast-feeding How should I use this medicine? Take this medicine by mouth with a glass of water. Follow the directions on the prescription label. Take this medicine with food or milk.  Take your doses at regular intervals. Do not take your medicine more often than directed. Do not stop taking except on your doctor's advice. Talk to your pediatrician regarding the use of this medicine in children. While this drug may be prescribed for selected conditions, precautions do apply. Overdosage: If you think you have taken too much of this medicine contact a poison control center or emergency room at once. NOTE: This medicine is only for you. Do not share this medicine with others. What if I miss a dose? If you miss a dose, take it as soon as you can. If it is almost time for your next dose, take only that dose. Do not take double or extra doses. What may interact with this medicine?  antacids containing magnesium trisilicate  probenecid  quinolone antibiotics like ciprofloxacin, lomefloxacin, norfloxacin and ofloxacin  sulfinpyrazone This list may not describe all possible interactions. Give your health care provider a list of all the medicines, herbs, non-prescription  drugs, or dietary supplements you use. Also tell them if you smoke, drink alcohol, or use illegal drugs. Some items may interact with your medicine. What should I watch for while using this medicine? Tell your doctor or health care professional if your symptoms do not improve or if you get new symptoms. Drink several glasses of water a day. If you are taking this medicine for a long time, visit your doctor for regular checks on your progress. If you are diabetic, you may get a false positive result for sugar in your urine with certain brands of urine tests. Check with your doctor. What side effects may I notice from receiving this medicine? Side effects that you should report to your doctor or health care professional as soon as possible:  allergic reactions like skin rash or hives, swelling of the face, lips, or tongue  chest pain  cough  difficulty breathing  dizziness, drowsiness  fever or  infection  joint aches or pains  pale or blue-tinted skin  redness, blistering, peeling or loosening of the skin, including inside the mouth  tingling, burning, pain, or numbness in hands or feet  unusual bleeding or bruising  unusually weak or tired  yellowing of eyes or skin Side effects that usually do not require medical attention (report to your doctor or health care professional if they continue or are bothersome):  dark urine  diarrhea  headache  loss of appetite  nausea or vomiting  temporary hair loss This list may not describe all possible side effects. Call your doctor for medical advice about side effects. You may report side effects to FDA at 1-800-FDA-1088. Where should I keep my medicine? Keep out of the reach of children. Store at room temperature between 15 and 30 degrees C (59 and 86 degrees F). Protect from light. Throw away any unused medicine after the expiration date. NOTE: This sheet is a summary. It may not cover all possible information. If you have questions about this medicine, talk to your doctor, pharmacist, or health care provider.  2021 Elsevier/Gold Standard (2007-10-27 15:56:47)

## 2020-06-07 LAB — URINE CULTURE

## 2020-09-05 ENCOUNTER — Encounter: Payer: Self-pay | Admitting: Nurse Practitioner

## 2020-09-05 ENCOUNTER — Telehealth: Payer: BC Managed Care – PPO | Admitting: Nurse Practitioner

## 2020-09-05 VITALS — Ht 66.0 in | Wt 165.0 lb

## 2020-09-05 DIAGNOSIS — J302 Other seasonal allergic rhinitis: Secondary | ICD-10-CM | POA: Diagnosis not present

## 2020-09-05 DIAGNOSIS — J329 Chronic sinusitis, unspecified: Secondary | ICD-10-CM | POA: Diagnosis not present

## 2020-09-05 MED ORDER — AZITHROMYCIN 250 MG PO TABS: ORAL_TABLET | ORAL | 0 refills | Status: AC

## 2020-09-05 MED ORDER — FLUTICASONE PROPIONATE 50 MCG/ACT NA SUSP: 2.0000 | Freq: Every day | NASAL | 6 refills | Status: DC

## 2020-09-05 NOTE — Progress Notes (Signed)
Virtual Visit via Video Note   This visit type was conducted due to national recommendations for restrictions regarding the COVID-19 Pandemic (e.g. social distancing) in an effort to limit this patient's exposure and mitigate transmission in our community.  Due to her co-morbid illnesses, this patient is at least at moderate risk for complications without adequate follow up.  This format is felt to be most appropriate for this patient at this time.  All issues noted in this document were discussed and addressed.  A limited physical exam was performed with this format.  A verbal consent was obtained for the virtual visit.   Date:  09/05/2020   ID:  Valerie Pennington, DOB 18-Jul-1975, MRN [id number]  Patient Location: Home Provider Location: Office/Clinic  PCP:  Blane Ohara, MD   Evaluation Performed:  Established patient, acute telemedicine visit  Chief Complaint:  Teeth pain  History of Present Illness:    Valerie Pennington is a 45 y.o. female with sinus congestion, jaw/facial pain, rhinorrhea, bilateral ear popping, and post-nasal drip. Onset of symptoms was 10 days ago. Treatment has included two different OTC antihistamines and Ibuprofen. She had a routine dental visit approximately 3-weeks ago. No cavities or acute problems were found during dental exam. Una has a past medical history of chronic allergic rhinitis.   The patient does have symptoms concerning for COVID-19 infection (fever, chills, cough, or new shortness of breath).    History reviewed. No pertinent past medical history.  History reviewed. No pertinent surgical history.  Family History  Problem Relation Age of Onset  . Breast cancer Mother 74  . Diabetes Father     Social History   Socioeconomic History  . Marital status: Married    Spouse name: Not on file  . Number of children: 2  . Years of education: Not on file  . Highest education level: Not on file  Occupational History  . Occupation:  Runner, broadcasting/film/video  Tobacco Use  . Smoking status: Never Smoker  . Smokeless tobacco: Never Used  Vaping Use  . Vaping Use: Never used  Substance and Sexual Activity  . Alcohol use: Never  . Drug use: Never  . Sexual activity: Not on file  Other Topics Concern  . Not on file  Social History Narrative  . Not on file   Social Determinants of Health   Financial Resource Strain: Not on file  Food Insecurity: Not on file  Transportation Needs: Not on file  Physical Activity: Not on file  Stress: Not on file  Social Connections: Not on file  Intimate Partner Violence: Not on file    Outpatient Medications Prior to Visit  Medication Sig Dispense Refill  . nitrofurantoin, macrocrystal-monohydrate, (MACROBID) 100 MG capsule Take 1 capsule (100 mg total) by mouth 2 (two) times daily. 14 capsule 0  . phenazopyridine (PYRIDIUM) 200 MG tablet Take 1 tablet (200 mg total) by mouth 3 (three) times daily as needed for pain. 10 tablet 0   No facility-administered medications prior to visit.    Allergies:   Penicillins and Sulfa antibiotics   Social History   Tobacco Use  . Smoking status: Never Smoker  . Smokeless tobacco: Never Used  Vaping Use  . Vaping Use: Never used  Substance Use Topics  . Alcohol use: Never  . Drug use: Never     Review of Systems  Constitutional: Positive for malaise/fatigue.  HENT: Positive for congestion, ear pain (bilateral ear fullness and popping) and sinus pain.  Maxillary and frontal sinus tenderness; Jaw and facial pain;   Eyes: Negative.   Respiratory: Negative.   Cardiovascular: Negative.   Gastrointestinal: Negative.   Genitourinary: Negative.   Musculoskeletal: Negative.   Skin: Negative for rash.  Neurological: Positive for headaches.  Endo/Heme/Allergies: Positive for environmental allergies.  Psychiatric/Behavioral: Negative.      Labs/Other Tests and Data Reviewed:    Recent Labs: No results found for requested labs within last  8760 hours.   Recent Lipid Panel No results found for: CHOL, TRIG, HDL, CHOLHDL, LDLCALC, LDLDIRECT  Wt Readings from Last 3 Encounters:  09/05/20 165 lb (74.8 kg)  06/05/20 165 lb (74.8 kg)     Objective:    Vital Signs:  Ht 5\' 6"  (1.676 m)   Wt 165 lb (74.8 kg)   BMI 26.63 kg/m    Physical Exam No physical exam due to telemedicine visit  ASSESSMENT & PLAN:   1. Other sinusitis, unspecified chronicity - azithromycin (ZITHROMAX) 250 MG tablet; Take 2 tablets on day 1, then 1 tablet daily on days 2 through 5  Dispense: 6 tablet; Refill: 0  2. Seasonal allergic rhinitis, unspecified trigger - fluticasone (FLONASE) 50 MCG/ACT nasal spray; Place 2 sprays into both nostrils daily.  Dispense: 16 g; Refill: 6   Rest and push fluids Continue OTC oral antihistamine Remove laundry basket from bedroom Follow-up as needed     Meds ordered this encounter  Medications  . azithromycin (ZITHROMAX) 250 MG tablet    Sig: Take 2 tablets on day 1, then 1 tablet daily on days 2 through 5    Dispense:  6 tablet    Refill:  0    Order Specific Question:   Supervising Provider    Answer  . fluticasone (FLONASE) 50 MCG/ACT nasal spray    Sig: Place 2 sprays into both nostrils daily.    Dispense:  16 g    Refill:  6    Order Specific Question:   Supervising Provider    AnswerCorey Harold    COVID-19 Education: The signs and symptoms of COVID-19 were discussed with the patient and how to seek care for testing (follow up with PCP or arrange E-visit). The importance of social distancing was discussed today.   I spent 7 minutes dedicated to the care of this patient on the date of this encounter to include face-to-face time with the patient, as well as:EMR and prescription medication management.  Follow Up:  In Person prn  Signed, Corey Harold, NP  09/05/2020 1:45 PM    [facility] [place]

## 2020-09-21 ENCOUNTER — Encounter: Payer: Self-pay | Admitting: Nurse Practitioner

## 2020-09-21 ENCOUNTER — Other Ambulatory Visit: Payer: Self-pay

## 2020-09-21 ENCOUNTER — Ambulatory Visit: Payer: BC Managed Care – PPO | Admitting: Nurse Practitioner

## 2020-09-21 VITALS — BP 92/62 | HR 55 | Temp 96.7°F | Ht 66.0 in | Wt 167.0 lb

## 2020-09-21 DIAGNOSIS — L237 Allergic contact dermatitis due to plants, except food: Secondary | ICD-10-CM

## 2020-09-21 MED ORDER — TRIAMCINOLONE ACETONIDE 40 MG/ML IJ SUSP
60.0000 mg | Freq: Once | INTRAMUSCULAR | Status: AC
  Administered 2020-09-21: 60 mg via INTRA_ARTICULAR

## 2020-09-21 MED ORDER — TRIAMCINOLONE ACETONIDE 0.1 % EX CREA: 1.0000 "application " | TOPICAL_CREAM | Freq: Two times a day (BID) | CUTANEOUS | 0 refills | Status: DC

## 2020-09-21 NOTE — Patient Instructions (Addendum)
Consider Tecnu products to prevent poison ivy dermatitis Kenalog 60 mg injection given in office May apply Triamcinolone cream to rash  Follow-up if no improvement or symptoms worsen  Poison Ivy Dermatitis Poison ivy dermatitis is redness and soreness of the skin caused by chemicals in the leaves of the poison ivy plant. You may have very bad itching, swelling, a rash, and blisters. What are the causes?  Touching a poison ivy plant.  Touching something that has the chemical on it. This may include animals or objects that have come in contact with the plant. What increases the risk?  Going outdoors often in wooded or [place] areas.  Going outdoors without wearing protective clothing, such as closed shoes, long pants, and a long-sleeved shirt. What are the signs or symptoms?  Skin redness.  Very bad itching.  A rash that often includes bumps and blisters. ? The rash usually appears 48 hours after exposure, if you have been exposed before. ? If this is the first time you have been exposed, the rash may not appear until a week after exposure.  Swelling. This may occur if the reaction is very bad. Symptoms usually last for 1-2 weeks. The first time you develop this condition, symptoms may last 3-4 weeks.   How is this treated? This condition may be treated with:  Hydrocortisone cream or calamine lotion to relieve itching.  Oatmeal baths to soothe the skin.  Medicines, such as over-the-counter antihistamine tablets.  Oral steroid medicine for more severe reactions. Follow these instructions at home: Medicines  Take or apply over-the-counter and prescription medicines only as told by your doctor.  Use hydrocortisone cream or calamine lotion as needed to help with itching. General instructions  Do not scratch or rub your skin.  Put a cold, wet cloth (cold compress) on the affected areas or take baths in cool water. This will help with itching.  Avoid hot baths and  showers.  Take oatmeal baths as needed. Use colloidal oatmeal. You can get this at a pharmacy or grocery store. Follow the instructions on the package.  While you have the rash, wash your clothes right after you wear them.  Keep all follow-up visits as told by your health care provider. This is important. How is this prevented?  Know what poison ivy looks like, so you can avoid it. ? This plant has three leaves with flowering branches on a single stem. ? The leaves are glossy. ? The leaves have uneven edges that come to a point at the front.  If you touch poison ivy, wash your skin with soap and water right away. Be sure to wash under your fingernails.  When hiking or camping, wear long pants, a long-sleeved shirt, tall socks, and hiking boots. You can also use a lotion on your skin that helps to prevent contact with poison ivy.  If you think that your clothes or outdoor gear came in contact with poison ivy, rinse them off with a garden hose before you bring them inside your house.  When doing yard work or gardening, wear gloves, long sleeves, long pants, and boots. Wash your garden tools and gloves if they come in contact with poison ivy.  If you think that your pet has come into contact with poison ivy, wash him or her with pet shampoo and water. Make sure to wear gloves while washing your pet.   Contact a doctor if:  You have open sores in the rash area.  You have more redness, swelling,  or pain in the rash area.  You have redness that spreads beyond the rash area.  You have fluid, blood, or pus coming from the rash area.  You have a fever.  You have a rash over a large area of your body.  You have a rash on your eyes, mouth, or genitals.  Your rash does not get better after a few weeks. Get help right away if:  Your face swells or your eyes swell shut.  You have trouble breathing.  You have trouble swallowing. These symptoms may be an emergency. Do not wait to see  if the symptoms will go away. Get medical help right away. Call your local emergency services (911 in the [place] Do not drive yourself to the hospital. Summary  Poison ivy dermatitis is redness and soreness of the skin caused by chemicals in the leaves of the poison ivy plant.  You may have skin redness, very bad itching, swelling, and a rash.  Do not scratch or rub your skin.  Take or apply over-the-counter and prescription medicines only as told by your doctor. This information is not intended to replace advice given to you by your health care provider. Make sure you discuss any questions you have with your health care provider. Document Revised: 07/30/2018 Document Reviewed: 04/02/2018 Elsevier Patient Education  2021 [employer].

## 2020-09-21 NOTE — Progress Notes (Signed)
Acute Office Visit  Subjective:    Patient ID: Valerie Pennington, female    DOB: 09-27-1975, 45 y.o.   MRN: [id number]  Chief Complaint  Patient presents with  . Rash    Poison ivy    HPI Valerie Pennington is a 45 year old Caucasian female that presents with right cheek facial rash. She tells me that she did some yard work approximately 3 days ago. States she placed a tarp over a poison ivy vine in her yard.   Rash: Patient complains of rash involving the face. Rash started 3 days ago. Appearance of rash at onset: Color of lesion(s): yellow. Rash has changed over time Initial distribution: face.  Discomfort associated with rash: is pruritic.  Associated symptoms: none. Denies: fever. Patient has had previous evaluation of rash. Patient has had previous treatment of Benadryl po and topical cream, hydrocortisone cream. Response to treatment not changed. Patient has had contacts with similar rash. Patient has identified precipitant. Patient has had new exposures to poison ivy.  History reviewed. No pertinent past medical history.  History reviewed. No pertinent surgical history.  Family History  Problem Relation Age of Onset  . Breast cancer Mother 59  . Diabetes Father     Social History   Socioeconomic History  . Marital status: Married    Spouse name: Not on file  . Number of children: 2  . Years of education: Not on file  . Highest education level: Not on file  Occupational History  . Occupation: Runner, broadcasting/film/video  Tobacco Use  . Smoking status: Never Smoker  . Smokeless tobacco: Never Used  Vaping Use  . Vaping Use: Never used  Substance and Sexual Activity  . Alcohol use: Never  . Drug use: Never  . Sexual activity: Not on file  Other Topics Concern  . Not on file  Social History Narrative  . Not on file      Outpatient Medications Prior to Visit  Medication Sig Dispense Refill  . fluticasone (FLONASE) 50 MCG/ACT nasal spray Place 2 sprays into both nostrils daily. 16 g  6  . hydrocortisone 2.5 % cream Apply topically 2 (two) times daily.     No facility-administered medications prior to visit.    Allergies  Allergen Reactions  . Penicillins Hives  . Sulfa Antibiotics Hives and Rash    hives     Review of Systems  Constitutional: Negative for fatigue and fever.  HENT: Negative for congestion, ear pain, sinus pressure and sore throat.   Eyes: Negative for pain.  Respiratory: Negative for cough, chest tightness, shortness of breath and wheezing.   Cardiovascular: Negative for chest pain and palpitations.  Gastrointestinal: Negative for abdominal pain, constipation, diarrhea, nausea and vomiting.  Genitourinary: Negative for dysuria and hematuria.  Musculoskeletal: Negative for arthralgias, back pain, joint swelling and myalgias.  Skin: Positive for rash (Poison ivy).  Neurological: Negative for dizziness, weakness and headaches.  Psychiatric/Behavioral: Negative for dysphoric mood. The patient is not nervous/anxious.        Objective:    Physical Exam Vitals reviewed.  Constitutional:      Appearance: Normal appearance.  HENT:     Head: Normocephalic.     Nose: Nose normal.     Mouth/Throat:     Mouth: Mucous membranes are moist.  Cardiovascular:     Rate and Rhythm: Normal rate and regular rhythm.     Pulses: Normal pulses.     Heart sounds: Normal heart sounds.  Pulmonary:     Effort:  Pulmonary effort is normal.     Breath sounds: Normal breath sounds.  Skin:    General: Skin is warm and dry.     Capillary Refill: Capillary refill takes less than 2 seconds.     Findings: Rash (right face/cheek) present.  Neurological:     General: No focal deficit present.     Mental Status: She is alert and oriented to person, place, and time.  Psychiatric:        Mood and Affect: Mood normal.        Behavior: Behavior normal.    BP 92/62 (BP Location: Left Arm, Patient Position: Sitting)   Pulse (!) 55   Temp (!) 96.7 F (35.9 C)  (Temporal)   Ht 5\' 6"  (1.676 m)   Wt 167 lb (75.8 kg)   SpO2 97%   BMI 26.95 kg/m  Wt Readings from Last 3 Encounters:  09/05/20 165 lb (74.8 kg)  06/05/20 165 lb (74.8 kg)    Health Maintenance Due  Topic Date Due  . HIV Screening  Never done  . Hepatitis C Screening  Never done  . PAP SMEAR-Modifier  Never done          Assessment & Plan:   1. Poison ivy dermatitis - triamcinolone acetonide (KENALOG-40) injection 60 mg - triamcinolone cream (KENALOG) 0.1 %; Apply 1 application topically 2 (two) times daily.  Dispense: 30 g; Refill: 0  I , Lauren M Auman,acting as a scribe for 06/07/20, NP.,have documented all relevant documentation on the behalf of [employer], NP,as directed by  Janie Morning, NP while in the presence of Janie Morning, NP.  I, Janie Morning, NP, have reviewed all documentation for this visit. The documentation on 09/23/20 for the exam, diagnosis, procedures, and orders are all accurate and complete.   Consider Tecnu products to prevent poison ivy dermatitis Kenalog 60 mg injection given in office May apply Triamcinolone cream to rash  Follow-up if no improvement or symptoms worsen  Follow-up: As needed  Signed,  June, DNP

## 2020-10-05 ENCOUNTER — Encounter: Payer: BC Managed Care – PPO | Admitting: Family Medicine

## 2020-10-19 HISTORY — PX: BREAST LUMPECTOMY: SHX2

## 2021-01-11 ENCOUNTER — Encounter: Payer: Self-pay | Admitting: Family Medicine

## 2021-01-11 ENCOUNTER — Ambulatory Visit (INDEPENDENT_AMBULATORY_CARE_PROVIDER_SITE_OTHER): Payer: BC Managed Care – PPO | Admitting: Family Medicine

## 2021-01-11 ENCOUNTER — Other Ambulatory Visit: Payer: Self-pay

## 2021-01-11 VITALS — BP 108/60 | HR 54 | Temp 97.3°F | Ht 67.0 in | Wt 167.0 lb

## 2021-01-11 DIAGNOSIS — Z0001 Encounter for general adult medical examination with abnormal findings: Secondary | ICD-10-CM | POA: Diagnosis not present

## 2021-01-11 NOTE — Progress Notes (Signed)
Subjective:  Patient ID: Valerie Pennington, female    DOB: 01-Dec-1975  Age: 45 y.o. MRN: [id number]  Chief Complaint  Patient presents with   Annual Exam    HPI Well Adult Physical: Patient here for a comprehensive physical exam.The patient reports she has some sinus pain. Wants to wait on flu vaccine until after she has her mammogram. Do you take any herbs or supplements that were not prescribed by a doctor? yes  - biotin. Are you taking calcium supplements? no Are you taking aspirin daily? no  Encounter for general adult medical examination without abnormal findings  Physical ("At Risk" items are starred): Patient's last physical exam was 1 year ago .  Smoking: Life-long non-smoker ;  Physical Activity: Exercises at least 5 times per week ; Walks for 30 minutes Diet: Eats healthy most days.  Alcohol/Drug Use: Is a non-drinker ; No illicit drug use ;  Patient is not afflicted from Stress Incontinence and Urge Incontinence  Safety: reviewed. Patient wears a seat belt, has smoke detectors, has carbon monoxide detectors, practices appropriate gun safety, and wears sunscreen with extended sun exposure. Dental Care: biannual cleanings, brushes and flosses daily. Ophthalmology/Optometry: Annual visit.  Hearing loss: none Vision impairments: reading glasses.   Menarche: 17 Menstrual History: regular LMP: 12/31/2020 Pregnancy history: 3 live births (2 living children, 1 deceased due to heart defect) Safe at home: yes Self breast exams: yes   Mammogram- Scheduled next week  Flowsheet Row Office Visit from 01/11/2021 in [facility]  PHQ-2 Total Score 0               Social Hx   Social History   Socioeconomic History   Marital status: Married    Spouse name: Not on file   Number of children: 2   Years of education: Not on file   Highest education level: Not on file  Occupational History   Occupation: Runner, broadcasting/film/video  Tobacco Use   Smoking status: Never   Smokeless  tobacco: Never  Vaping Use   Vaping Use: Never used  Substance and Sexual Activity   Alcohol use: Never   Drug use: Never   Sexual activity: Not on file  Other Topics Concern   Not on file  Social History Narrative   Not on file   Social Determinants of Health   Financial Resource Strain: Not on file  Food Insecurity: Not on file  Transportation Needs: Not on file  Physical Activity: Not on file  Stress: Not on file  Social Connections: Not on file   History reviewed. No pertinent past medical history. Past Surgical History:  Procedure Laterality Date   BREAST LUMPECTOMY Right 10/2020    Family History  Problem Relation Age of Onset   Breast cancer Mother 49   Diabetes Father    DiGeorge syndrome Child     Review of Systems  Constitutional:  Negative for chills, fatigue and fever.  HENT:  Positive for congestion. Negative for ear pain, rhinorrhea, sinus pressure, sinus pain and sore throat.        "Teeth" ache. Mouth pain.  Respiratory:  Negative for cough and shortness of breath.   Cardiovascular:  Negative for chest pain.  Gastrointestinal:  Negative for abdominal pain, constipation, diarrhea, nausea and vomiting.  Genitourinary:  Negative for dysuria and urgency.  Musculoskeletal:  Negative for arthralgias, back pain and myalgias.  Neurological:  Negative for dizziness, weakness, light-headedness and headaches.  Psychiatric/Behavioral:  Negative for dysphoric mood. The patient is  not nervous/anxious.     Objective:  BP 108/60   Pulse (!) 54   Temp (!) 97.3 F (36.3 C)   Ht 5\' 7"  (1.702 m)   Wt 167 lb (75.8 kg)   SpO2 100%   BMI 26.16 kg/m   BP/Weight 01/11/2021 09/21/2020 09/05/2020  Systolic BP 108 92 -  Diastolic BP 60 62 -  Wt. (Lbs) 167 167 165  BMI 26.16 26.95 26.63    Physical Exam Vitals reviewed.  Constitutional:      General: She is not in acute distress.    Appearance: Normal appearance. She is normal weight.  HENT:     Right Ear:  Tympanic membrane and ear canal normal.     Left Ear: Tympanic membrane and ear canal normal.     Nose: Nose normal. No congestion or rhinorrhea.  Eyes:     Conjunctiva/sclera: Conjunctivae normal.  Neck:     Thyroid: No thyroid mass.     Vascular: No carotid bruit.  Cardiovascular:     Rate and Rhythm: Normal rate and regular rhythm.     Pulses: Normal pulses.     Heart sounds: Normal heart sounds. No murmur heard. Pulmonary:     Effort: Pulmonary effort is normal.     Breath sounds: Normal breath sounds.  Abdominal:     General: Bowel sounds are normal.     Palpations: Abdomen is soft. There is no mass.     Tenderness: There is no abdominal tenderness.  Musculoskeletal:        General: Normal range of motion.  Skin:    General: Skin is warm and dry.     Findings: No lesion or rash.  Neurological:     Mental Status: She is alert and oriented to person, place, and time.     Cranial Nerves: No cranial nerve deficit.  Psychiatric:        Mood and Affect: Mood normal.        Behavior: Behavior normal.    No results found for: WBC, HGB, HCT, PLT, GLUCOSE, CHOL, TRIG, HDL, LDLDIRECT, LDLCALC, ALT, AST, NA, K, CL, CREATININE, BUN, CO2, TSH, PSA, INR, GLUF, HGBA1C, MICROALBUR    Assessment & Plan:  1. Encounter for general adult medical examination with abnormal findings Recommend continue to work on eating healthy diet and exercise.  - CBC with Differential/Platelet - Comprehensive metabolic panel - Lipid panel - TSH   Body mass index is 26.16 kg/m.   This is a list of the screening recommended for you and due dates:  Health Maintenance  Topic Date Due   HIV Screening  Never done   Hepatitis C Screening: USPSTF Recommendation to screen - Ages 39-79 yo.  Never done   Pap Smear  Never done   Flu Shot  07/19/2021*   Tetanus Vaccine  11/14/2024   COVID-19 Vaccine  Completed   HPV Vaccine  Aged Out  *Topic was postponed. The date shown is not the original due date.      AN INDIVIDUALIZED CARE PLAN: was established or reinforced today.   SELF MANAGEMENT: The patient and I together assessed ways to personally work towards obtaining the recommended goals  Support needs The patient and/or family needs were assessed and services were offered if appropriate.  Follow-up: Return in about 1 year (around 01/11/2022) for CPE.  An After Visit Summary was printed and given to the patient.  01/13/2022, MD [facility] [phone]

## 2021-01-12 LAB — CBC WITH DIFFERENTIAL/PLATELET
Basophils Absolute: 0 10*3/uL (ref 0.0–0.2)
Basos: 1 %
EOS (ABSOLUTE): 0.2 10*3/uL (ref 0.0–0.4)
Eos: 2 %
Hematocrit: 42 % (ref 34.0–46.6)
Hemoglobin: 14.3 g/dL (ref 11.1–15.9)
Immature Grans (Abs): 0 10*3/uL (ref 0.0–0.1)
Immature Granulocytes: 0 %
Lymphocytes Absolute: 1.9 10*3/uL (ref 0.7–3.1)
Lymphs: 27 %
MCH: 32.1 pg (ref 26.6–33.0)
MCHC: 34 g/dL (ref 31.5–35.7)
MCV: 94 fL (ref 79–97)
Monocytes Absolute: 0.6 10*3/uL (ref 0.1–0.9)
Monocytes: 9 %
Neutrophils Absolute: 4.4 10*3/uL (ref 1.4–7.0)
Neutrophils: 61 %
Platelets: 274 10*3/uL (ref 150–450)
RBC: 4.45 x10E6/uL (ref 3.77–5.28)
RDW: 11.4 % — ABNORMAL LOW (ref 11.7–15.4)
WBC: 7.2 10*3/uL (ref 3.4–10.8)

## 2021-01-12 LAB — COMPREHENSIVE METABOLIC PANEL
ALT: 11 IU/L (ref 0–32)
AST: 12 IU/L (ref 0–40)
Albumin/Globulin Ratio: 2 (ref 1.2–2.2)
Albumin: 4.3 g/dL (ref 3.8–4.8)
Alkaline Phosphatase: 60 IU/L (ref 44–121)
BUN/Creatinine Ratio: 12 (ref 9–23)
BUN: 9 mg/dL (ref 6–24)
Bilirubin Total: 0.4 mg/dL (ref 0.0–1.2)
CO2: 22 mmol/L (ref 20–29)
Calcium: 9.6 mg/dL (ref 8.7–10.2)
Chloride: 101 mmol/L (ref 96–106)
Creatinine, Ser: 0.77 mg/dL (ref 0.57–1.00)
Globulin, Total: 2.1 g/dL (ref 1.5–4.5)
Glucose: 81 mg/dL (ref 65–99)
Potassium: 4.5 mmol/L (ref 3.5–5.2)
Sodium: 138 mmol/L (ref 134–144)
Total Protein: 6.4 g/dL (ref 6.0–8.5)
eGFR: 98 mL/min/{1.73_m2} (ref >59)

## 2021-01-12 LAB — LIPID PANEL
Chol/HDL Ratio: 2.5 ratio (ref 0.0–4.4)
Cholesterol, Total: 174 mg/dL (ref 100–199)
HDL: 69 mg/dL (ref >39)
LDL Chol Calc (NIH): 95 mg/dL (ref 0–99)
Triglycerides: 47 mg/dL (ref 0–149)
VLDL Cholesterol Cal: 10 mg/dL (ref 5–40)

## 2021-01-12 LAB — TSH: TSH: 3.69 u[IU]/mL (ref 0.450–4.500)

## 2021-01-12 LAB — CARDIOVASCULAR RISK ASSESSMENT

## 2021-02-01 ENCOUNTER — Other Ambulatory Visit: Payer: Self-pay

## 2021-02-01 ENCOUNTER — Ambulatory Visit: Payer: BC Managed Care – PPO | Admitting: Family Medicine

## 2021-02-01 VITALS — BP 110/70 | HR 120 | Temp 97.4°F | Resp 16 | Ht 67.0 in | Wt 166.0 lb

## 2021-02-01 DIAGNOSIS — R928 Other abnormal and inconclusive findings on diagnostic imaging of breast: Secondary | ICD-10-CM

## 2021-02-01 NOTE — Progress Notes (Signed)
Subjective:  Patient ID: Valerie Pennington, female    DOB: 1975/06/06  Age: 45 y.o. MRN: [id number]  Chief Complaint  Patient presents with   Breast Problem    She wants a MRI.    HPI Patient is concerned about Family history of breast cancer. Her mother had metastatic breast cancer and she had dense tissue on her breast, so that made it harder to diagnostic her. Patient has the same problem. She already had lumpectomy on Right side and abnormal screening mammograms. She wants to have a supplemental MRI.  Current Outpatient Medications on File Prior to Visit  Medication Sig Dispense Refill   BIOTIN PO Take by mouth.     No current facility-administered medications on file prior to visit.   No past medical history on file. Past Surgical History:  Procedure Laterality Date   BREAST LUMPECTOMY Right 10/2020    Family History  Problem Relation Age of Onset   Breast cancer Mother 21   Diabetes Father    DiGeorge syndrome Child    Social History   Socioeconomic History   Marital status: Married    Spouse name: Not on file   Number of children: 2   Years of education: Not on file   Highest education level: Not on file  Occupational History   Occupation: Teacher  Tobacco Use   Smoking status: Never   Smokeless tobacco: Never  Vaping Use   Vaping Use: Never used  Substance and Sexual Activity   Alcohol use: Never   Drug use: Never   Sexual activity: Not on file  Other Topics Concern   Not on file  Social History Narrative   Not on file   Social Determinants of Health   Financial Resource Strain: Not on file  Food Insecurity: Not on file  Transportation Needs: Not on file  Physical Activity: Not on file  Stress: Not on file  Social Connections: Not on file    Review of Systems  Constitutional:  Negative for chills, fatigue and fever.  HENT:  Negative for congestion, ear pain and sore throat.   Respiratory:  Negative for cough and shortness of breath.    Cardiovascular:  Negative for chest pain and palpitations.  Gastrointestinal:  Negative for abdominal pain, constipation, diarrhea, nausea and vomiting.  Endocrine: Negative for polydipsia, polyphagia and polyuria.  Genitourinary:  Negative for difficulty urinating and dysuria.  Musculoskeletal:  Negative for arthralgias, back pain and myalgias.  Skin:  Negative for rash.  Neurological:  Negative for headaches.  Psychiatric/Behavioral:  Negative for dysphoric mood. The patient is not nervous/anxious.     Objective:  BP 110/70   Pulse (!) 120   Temp (!) 97.4 F (36.3 C)   Resp 16   Ht 5\' 7"  (1.702 m)   Wt 166 lb (75.3 kg)   SpO2 98%   BMI 26.00 kg/m   BP/Weight 02/01/2021 01/11/2021 09/21/2020  Systolic BP 110 108 92  Diastolic BP 70 60 62  Wt. (Lbs) 166 167 167  BMI 26 26.16 26.95    Physical Exam Vitals reviewed.  Constitutional:      Appearance: Normal appearance.  Cardiovascular:     Rate and Rhythm: Normal rate and regular rhythm.     Heart sounds: Normal heart sounds.  Pulmonary:     Effort: Pulmonary effort is normal.     Breath sounds: Normal breath sounds.  Neurological:     Mental Status: She is alert.    Diabetic Foot Exam -  Simple   No data filed      Lab Results  Component Value Date   WBC 7.2 01/11/2021   HGB 14.3 01/11/2021   HCT 42.0 01/11/2021   PLT 274 01/11/2021   GLUCOSE 81 01/11/2021   CHOL 174 01/11/2021   TRIG 47 01/11/2021   HDL 69 01/11/2021   LDLCALC 95 01/11/2021   ALT 11 01/11/2021   AST 12 01/11/2021   NA 138 01/11/2021   K 4.5 01/11/2021   CL 101 01/11/2021   CREATININE 0.77 01/11/2021   BUN 9 01/11/2021   CO2 22 01/11/2021   TSH 3.690 01/11/2021      Assessment & Plan:   Problem List Items Addressed This Visit   None Visit Diagnoses     Abnormality of both breasts on screening mammogram    -  Primary     .  No orders of the defined types were placed in this encounter.   No orders of the defined types  were placed in this encounter.    Follow-up: No follow-ups on file.  An After Visit Summary was printed and given to the patient.  Blane Ohara, MD [facility] [phone]

## 2021-02-03 DIAGNOSIS — R928 Other abnormal and inconclusive findings on diagnostic imaging of breast: Secondary | ICD-10-CM | POA: Insufficient documentation

## 2021-02-10 ENCOUNTER — Encounter: Payer: Self-pay | Admitting: Family Medicine

## 2021-02-18 ENCOUNTER — Other Ambulatory Visit: Payer: Self-pay

## 2021-02-18 ENCOUNTER — Encounter: Payer: Self-pay | Admitting: Family Medicine

## 2021-02-18 DIAGNOSIS — R928 Other abnormal and inconclusive findings on diagnostic imaging of breast: Secondary | ICD-10-CM

## 2021-04-17 ENCOUNTER — Other Ambulatory Visit: Payer: Self-pay | Admitting: Family Medicine

## 2021-04-17 ENCOUNTER — Ambulatory Visit
Admission: RE | Admit: 2021-04-17 | Discharge: 2021-04-17 | Disposition: A | Discharge status: Home / Self Care | Payer: BC Managed Care – PPO | Source: Ambulatory Visit | Attending: Family Medicine | Admitting: Family Medicine

## 2021-04-17 DIAGNOSIS — R928 Other abnormal and inconclusive findings on diagnostic imaging of breast: Secondary | ICD-10-CM

## 2021-04-17 DIAGNOSIS — N6313 Unspecified lump in the right breast, lower outer quadrant: Secondary | ICD-10-CM

## 2021-04-17 DIAGNOSIS — K769 Liver disease, unspecified: Secondary | ICD-10-CM

## 2021-04-17 IMAGING — MR MR BREAST BILAT WO/W CM
8 of 12 series · 32 of 48 positions shown · IV contrast (7 ml gadavist)
Comparison: Previous exam(s).

CLINICAL DATA: Patient with history surgical resection right breast
complex sclerosing lesion. Family history of breast cancer.

EXAM:
BILATERAL BREAST MRI WITH AND WITHOUT CONTRAST
TECHNIQUE: Multiplanar, multisequence MR images of both breasts were obtained
prior to and following the intravenous administration of 7 ml of
Gadavist

[Series 2: t2_tirm_tra ipat (a-p) · axial · 3.0mm · 0.66mm/px · 1 of 55 slices shown]
[im 1/55]
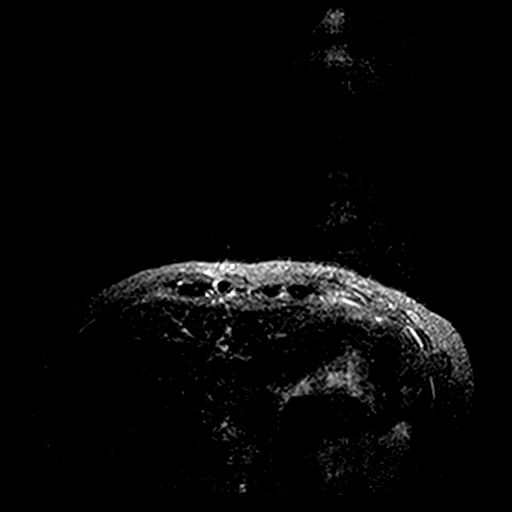

[Series 3: fl3d pre-cm no · axial · non-contrast · 1.2mm · 0.89mm/px · z∈[-74,+98]mm · 5 of 144 slices shown]
[im 1/144]
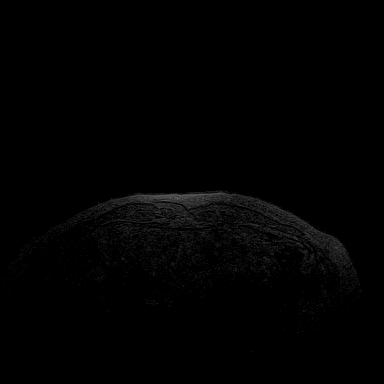
[im 36/144]
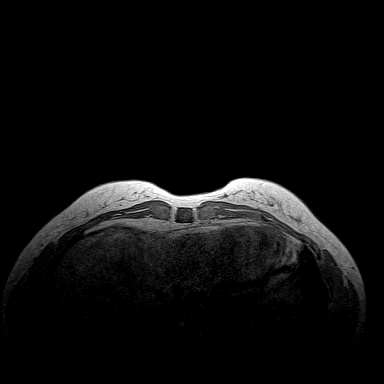
[im 72/144]
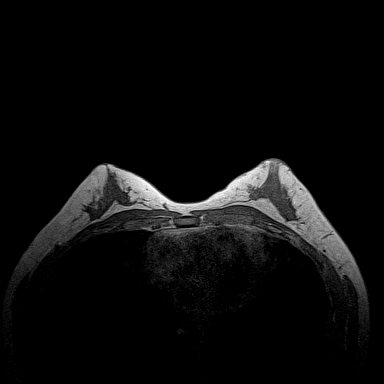
[im 108/144]
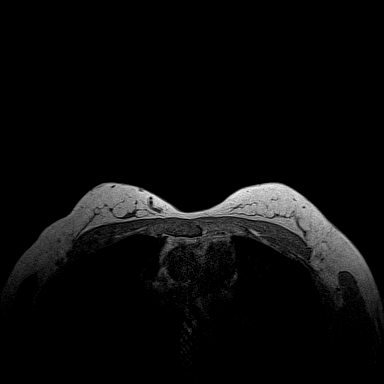
[im 144/144]
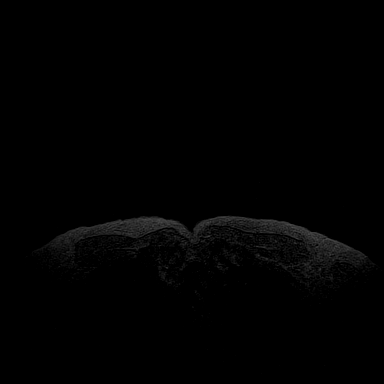

[Series 4: fl3d pre-cm · axial · non-contrast · 1.2mm · 0.89mm/px · z∈[-74,+98]mm · 5 of 144 slices shown]
[im 1/144]
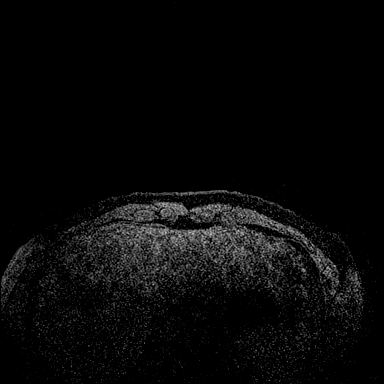
[im 36/144]
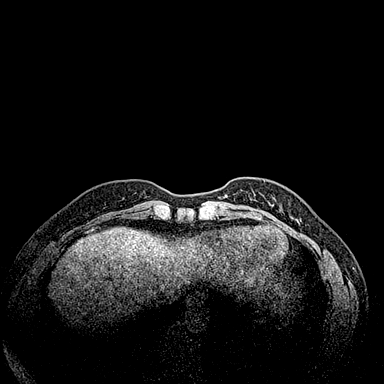
[im 72/144]
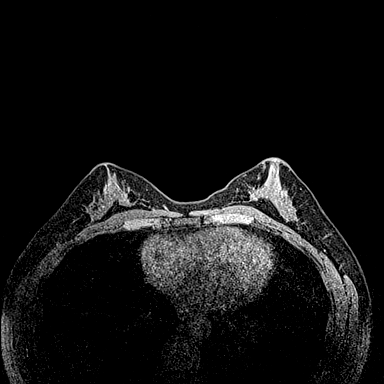
[im 108/144]
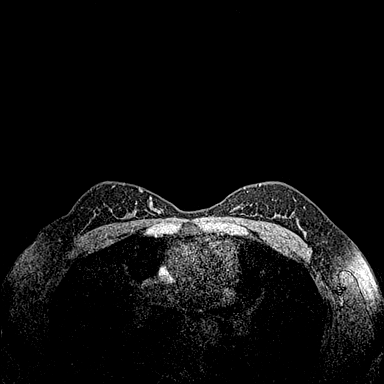
[im 144/144]
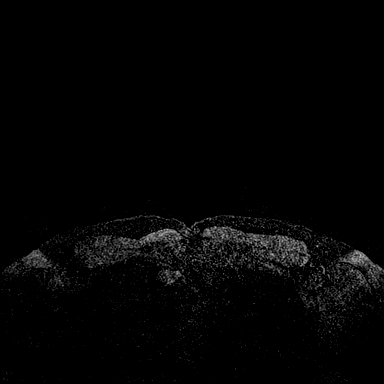

[Series 5: fl3d post-cm 20 · axial · 1.2mm · 0.89mm/px · z∈[-74,+98]mm · 5 of 144 slices shown (1 of 3)]
[im 1/144]
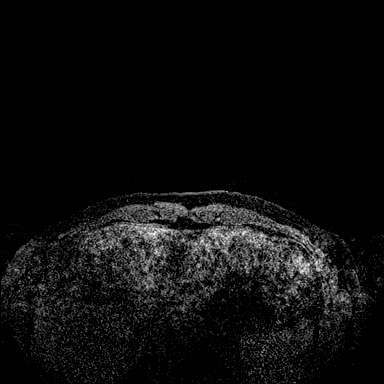
[im 36/144]
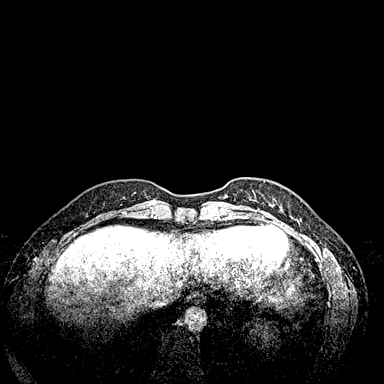
[im 72/144]
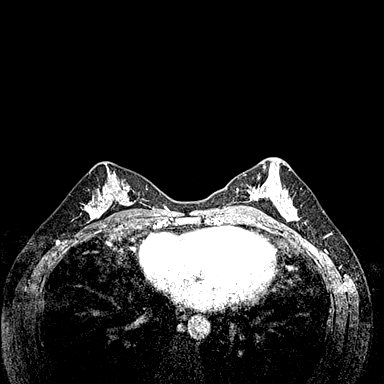
[im 108/144]
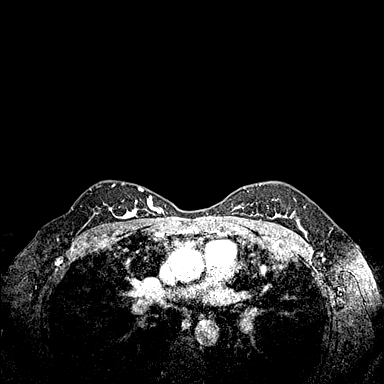
[im 144/144]
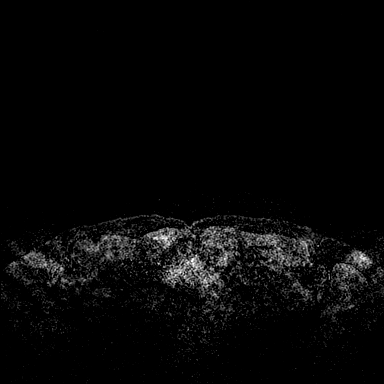

[Series 6: fl3d post-cm 20 · axial · 1.2mm · 0.89mm/px · z∈[-74,+98]mm · 5 of 144 slices shown (2 of 3)]
[im 1/144]
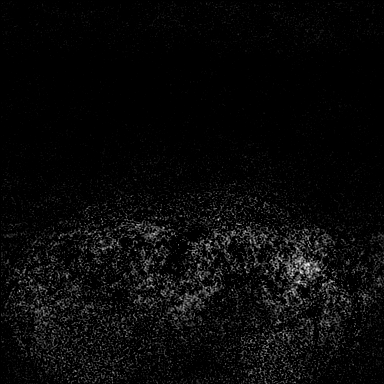
[im 36/144]
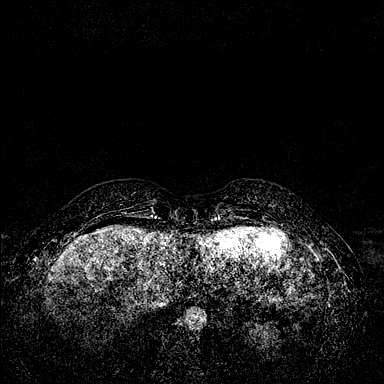
[im 72/144]
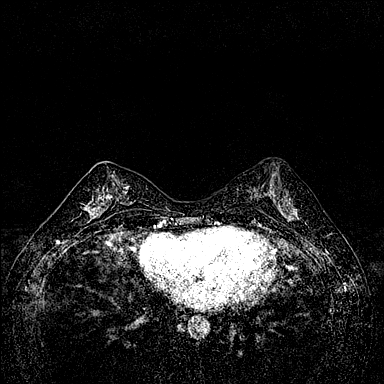
[im 108/144]
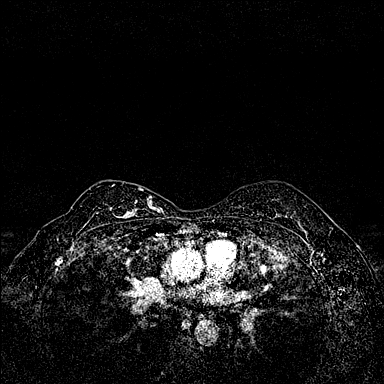
[im 144/144]
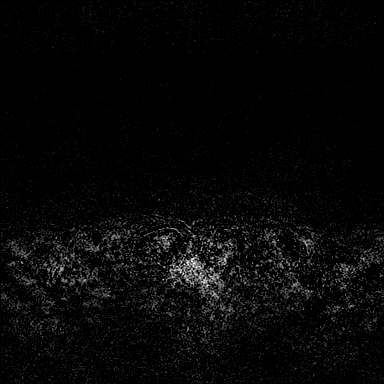

[Series 7: fl3d post-cm 20 · axial · 172.8mm · 0.89mm/px · 1 of 1 slices shown (3 of 3)]
[im 1/1]
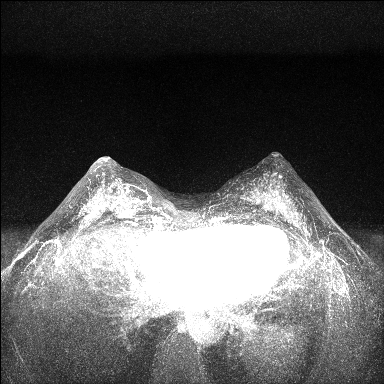

[Series 8: fl3d post-cm 3 · axial · 1.2mm · 0.89mm/px · z∈[-74,+98]mm · 6 of 144 slices shown (1 of 2)]
[im 1/144]
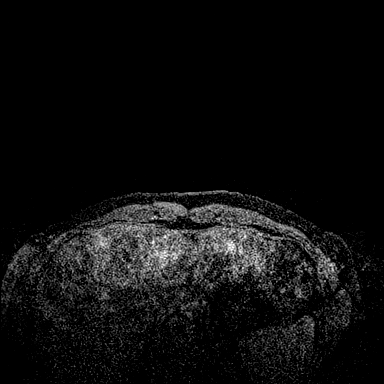
[im 29/144]
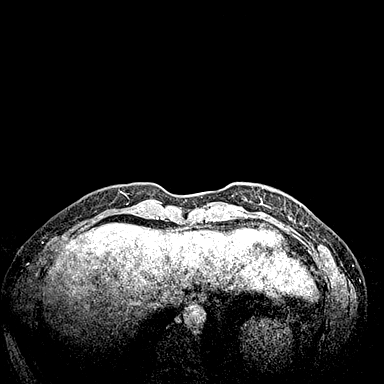
[im 58/144]
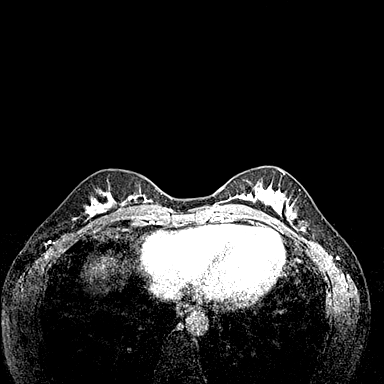
[im 86/144]
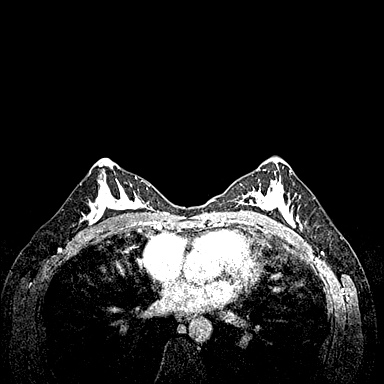
[im 115/144]
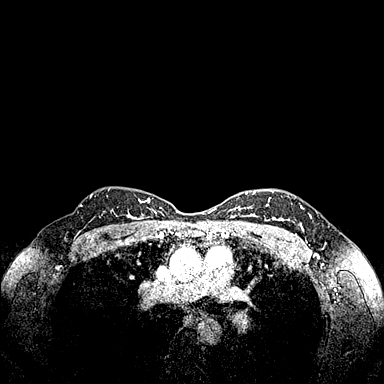
[im 144/144]
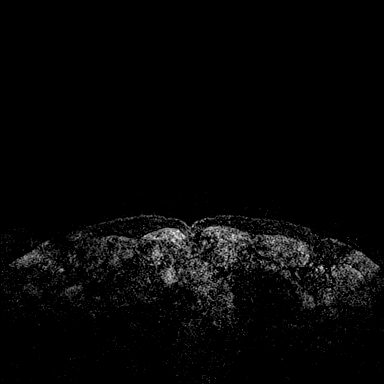

[Series 9: fl3d post-cm 3 · axial · 1.2mm · 0.89mm/px · z∈[-74,+28]mm · 4 of 144 slices shown (2 of 2)]
[im 1/144]
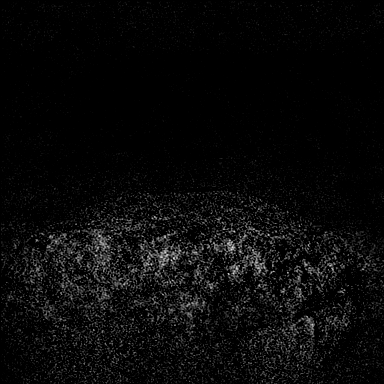
[im 29/144]
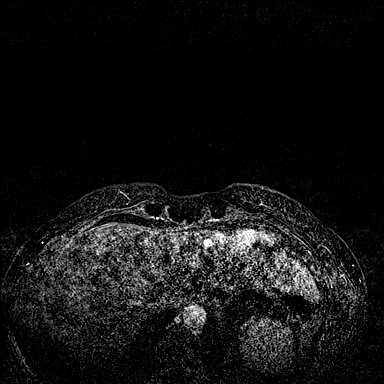
[im 58/144]
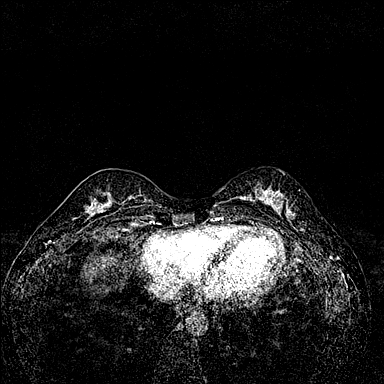
[im 86/144]
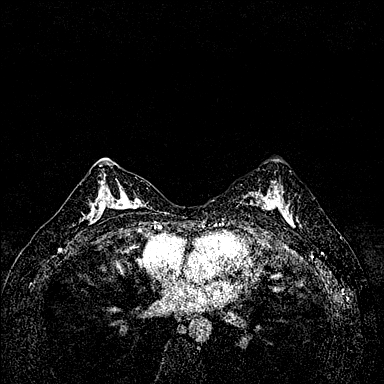

[32 of 48 positions shown; findings below may reference images not displayed]

Three-dimensional MR images were rendered by post-processing of the
original MR data on an independent workstation. The
three-dimensional MR images were interpreted, and findings are
reported in the following complete MRI report for this study. Three
dimensional images were evaluated at the independent interpreting
workstation using the DynaCAD thin client.
FINDINGS: Breast composition: c. Heterogeneous fibroglandular tissue.

Background parenchymal enhancement: Marked

Right breast: Within the lower outer right breast posterior depth
there is an 11 x 8 mm irregular masslike area of enhancement with
mixed kinetics. This is somewhat obscured by the surrounding
background parenchymal enhancement. No additional concerning areas
of enhancement identified throughout the right breast.

Left breast: No mass or abnormal enhancement.

Lymph nodes: No abnormal appearing lymph nodes.

Ancillary findings: Within the left hepatic lobe there is a 1.1 cm
mildly T2 bright enhancing lesion within the liver. This needs
dedicated evaluation with MRI (image 116; series 9) (image 46;
series 2)
IMPRESSION: 1. Indeterminate masslike area of enhancement within the lower outer
right breast, partially obscured from the marked background
parenchymal enhancement.
2. Indeterminate enhancing lesion within the left hepatic lobe. This
needs dedicated evaluation with abdominal MRI.

RECOMMENDATION:
1. MRI guided core needle biopsy of the irregular masslike area of
enhancement within the lower outer right breast with mixed kinetics.
2. Dedicated abdominal MRI for further evaluation of the enhancing
lesion within the left hepatic lobe.

BI-RADS CATEGORY  4: Suspicious.

## 2021-04-17 MED ORDER — GADOBUTROL 1 MMOL/ML IV SOLN
7.0000 mL | Freq: Once | INTRAVENOUS | Status: AC | PRN
  Administered 2021-04-17: 11:00:00 7 mL via INTRAVENOUS

## 2021-04-19 ENCOUNTER — Other Ambulatory Visit: Payer: Self-pay | Admitting: Family Medicine

## 2021-04-19 DIAGNOSIS — N6313 Unspecified lump in the right breast, lower outer quadrant: Secondary | ICD-10-CM

## 2021-04-25 ENCOUNTER — Other Ambulatory Visit: Payer: Self-pay

## 2021-04-25 ENCOUNTER — Other Ambulatory Visit: Payer: Self-pay | Admitting: Family Medicine

## 2021-04-25 ENCOUNTER — Ambulatory Visit
Admission: RE | Admit: 2021-04-25 | Discharge: 2021-04-25 | Disposition: A | Discharge status: Home / Self Care | Payer: BC Managed Care – PPO | Source: Ambulatory Visit | Attending: Family Medicine | Admitting: Family Medicine

## 2021-04-25 ENCOUNTER — Ambulatory Visit: Admission: RE | Admit: 2021-04-25 | Payer: BC Managed Care – PPO | Source: Ambulatory Visit

## 2021-04-25 DIAGNOSIS — N6313 Unspecified lump in the right breast, lower outer quadrant: Secondary | ICD-10-CM

## 2021-04-25 IMAGING — MR MR BREAST*R* WO/W CM
7 of 8 series · 35 of 48 positions shown · IV contrast (gadavist)
Comparison: [DATE]

CLINICAL DATA: The patient presented for MRI guided biopsy of an 11
x 8 mm irregular masslike area of enhancement with mixed kinetics in
the lower outer posterior right breast.

EXAM:
MR OF THE RIGHT BREAST WITH AND WITHOUT CONTRAST
TECHNIQUE: Multiplanar, multisequence MR images of the right breast were
obtained prior to and following the intravenous administration of 5
ml of Gadavist.

[Series 2: fiducial unilateral · sagittal · 2.0mm · 1.33mm/px · 1 of 52 slices shown]
[im 1/52]
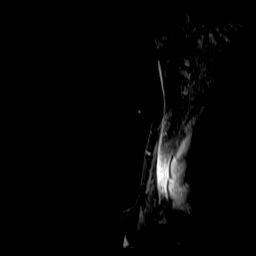

[Series 3: dynamic pre · axial · non-contrast · 1.3mm · 0.73mm/px · z∈[-97,+89]mm · 6 of 144 slices shown]
[im 1/144]
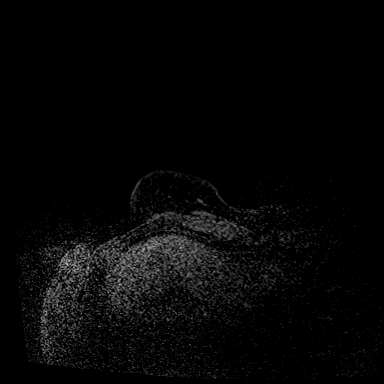
[im 29/144]
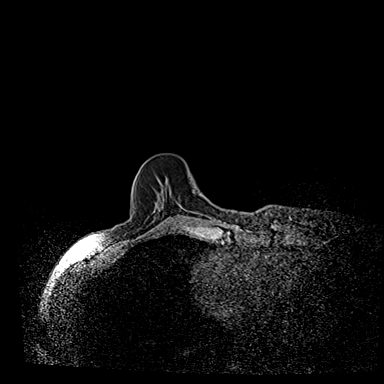
[im 58/144]
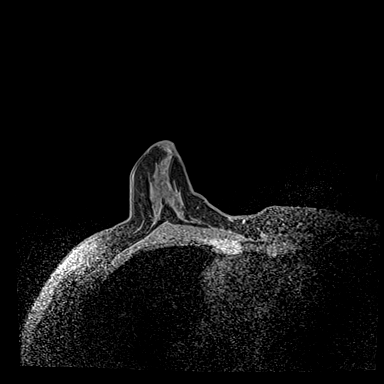
[im 86/144]
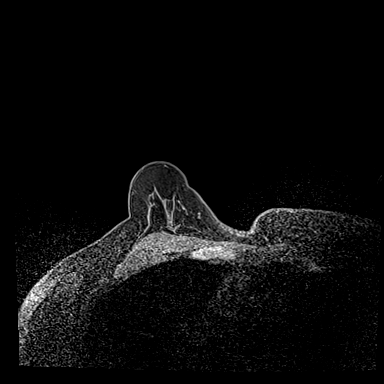
[im 115/144]
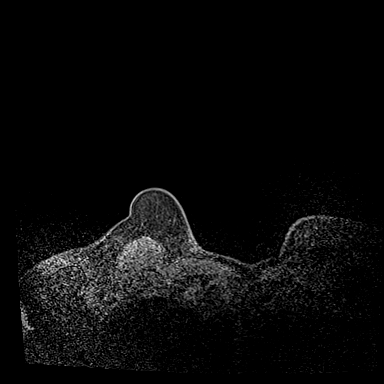
[im 144/144]
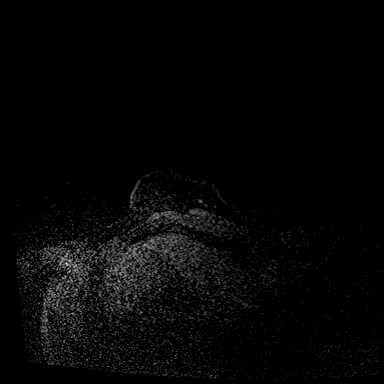

[Series 4: dynamic post 20 · axial · 1.3mm · 0.73mm/px · z∈[-97,+89]mm · 6 of 144 slices shown (1 of 2)]
[im 1/144]
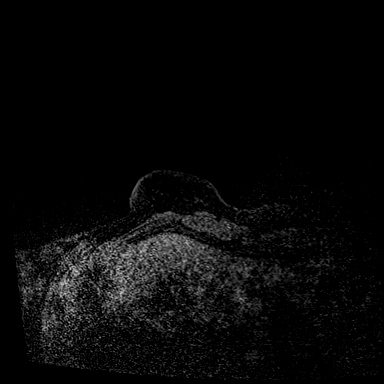
[im 29/144]
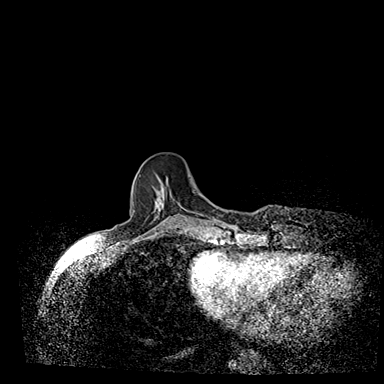
[im 58/144]
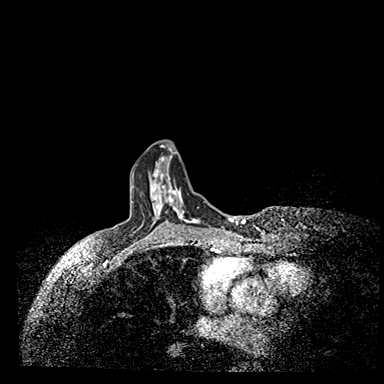
[im 86/144]
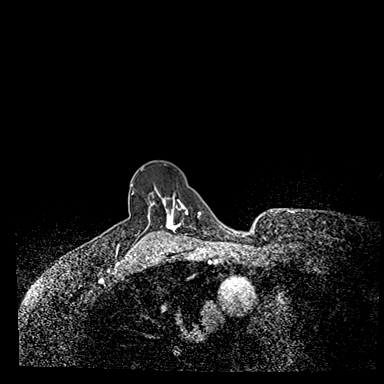
[im 115/144]
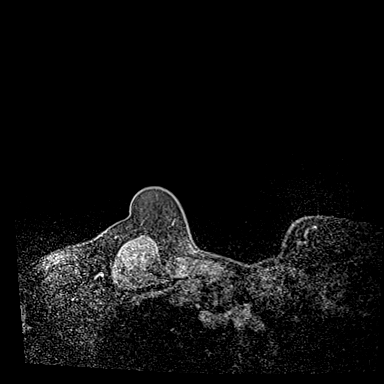
[im 144/144]
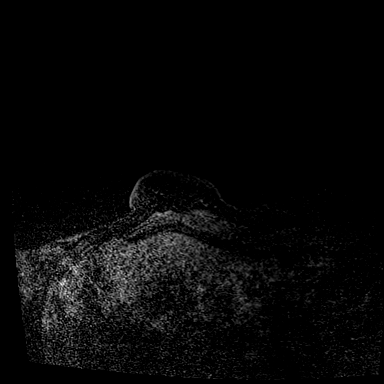

[Series 5: dynamic post 20 · axial · 1.3mm · 0.73mm/px · z∈[-97,+89]mm · 7 of 144 slices shown (2 of 2)]
[im 1/144]
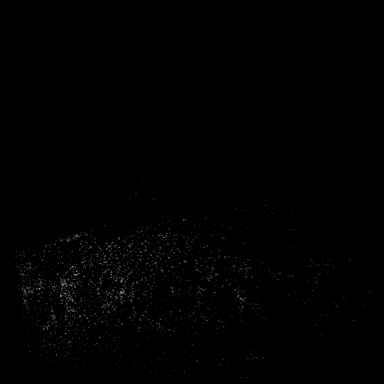
[im 24/144]
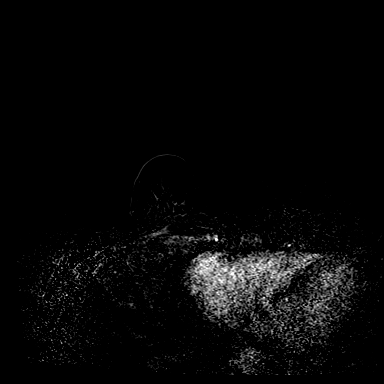
[im 48/144]
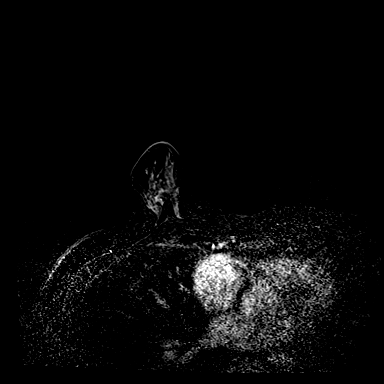
[im 72/144]
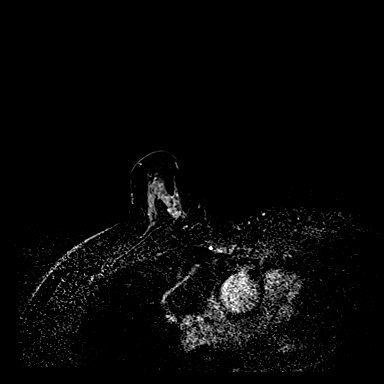
[im 96/144]
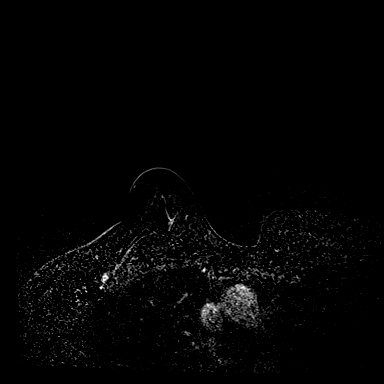
[im 120/144]
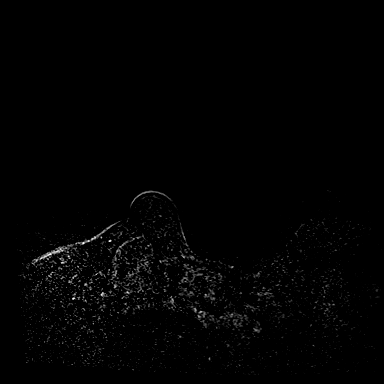
[im 144/144]
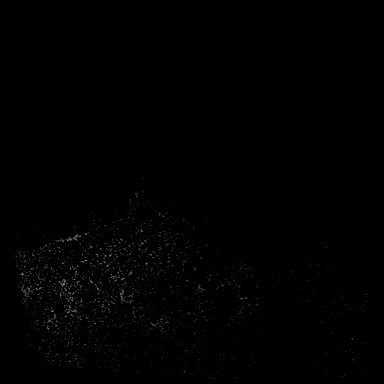

[Series 6: dynamic post 3 · axial · 1.3mm · 0.73mm/px · z∈[-97,+89]mm · 7 of 144 slices shown (1 of 2)]
[im 1/144]
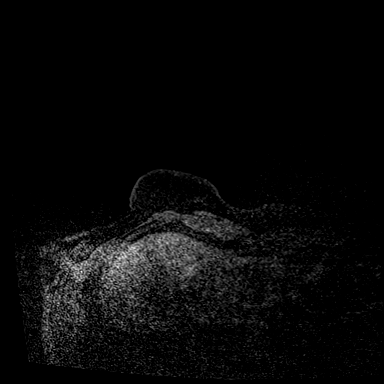
[im 24/144]
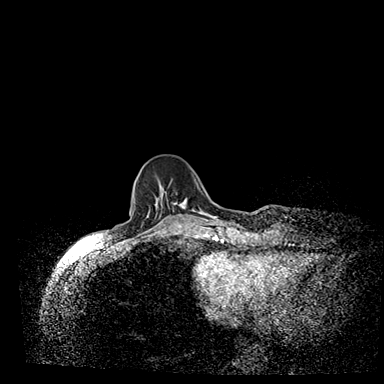
[im 48/144]
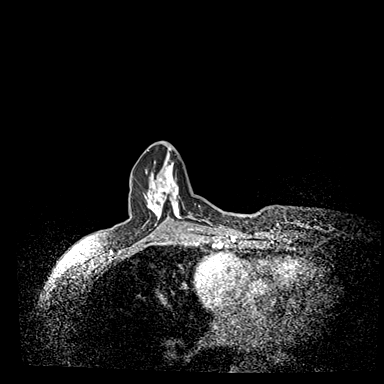
[im 72/144]
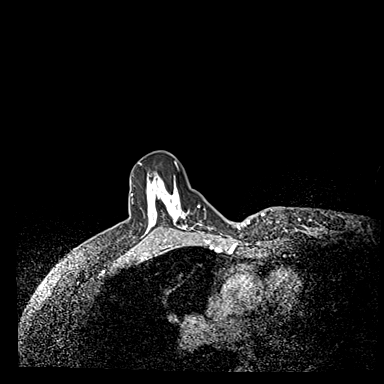
[im 96/144]
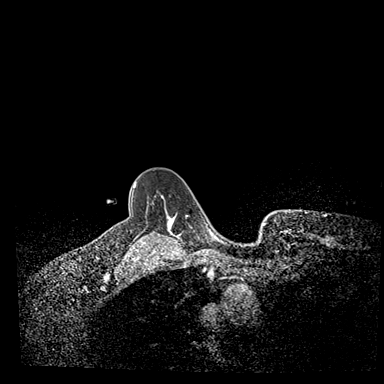
[im 120/144]
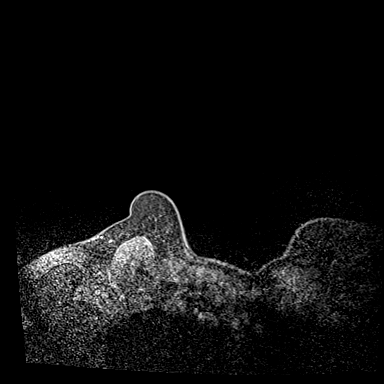
[im 144/144]
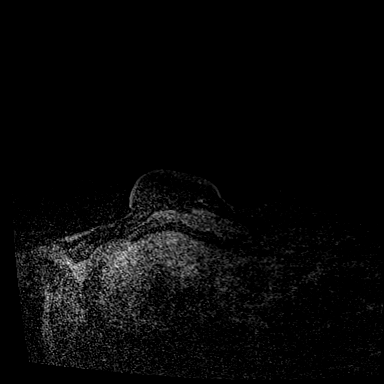

[Series 7: dynamic post 3 · axial · 1.3mm · 0.73mm/px · z∈[-97,+89]mm · 7 of 144 slices shown (2 of 2)]
[im 1/144]
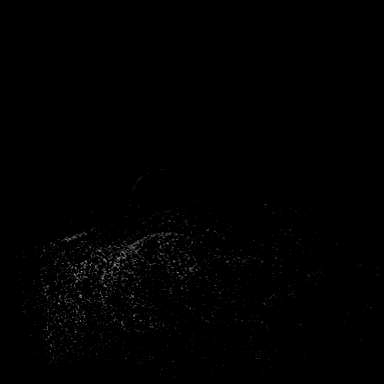
[im 24/144]
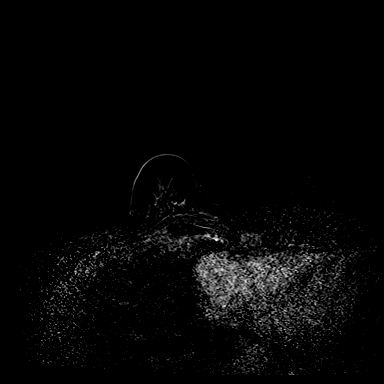
[im 48/144]
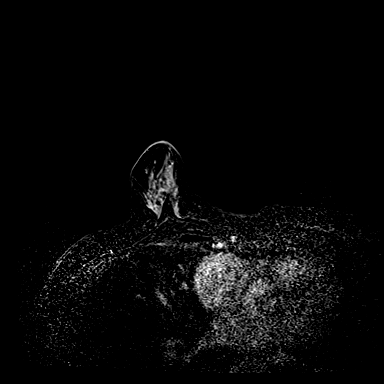
[im 72/144]
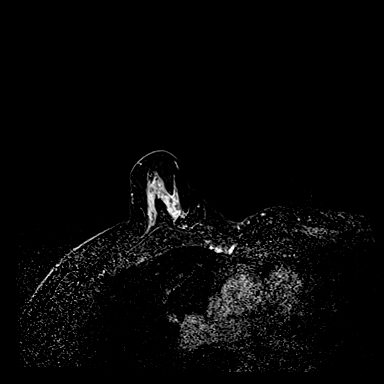
[im 96/144]
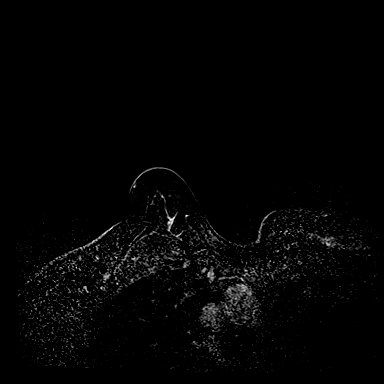
[im 120/144]
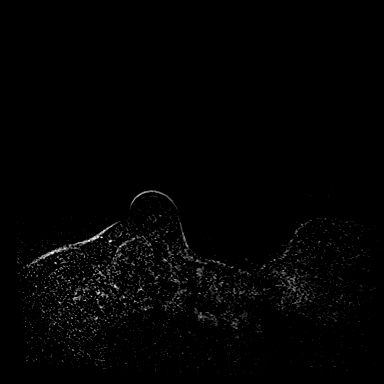
[im 144/144]
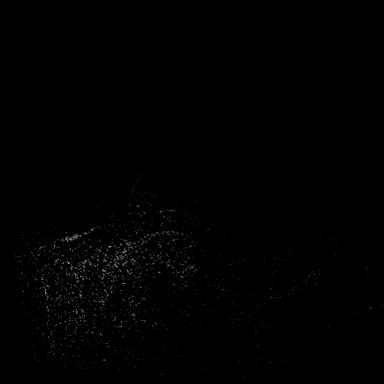

[Series 8: dynamic post 6 · axial · 1.3mm · 0.73mm/px · 1 of 144 slices shown]
[im 1/144]
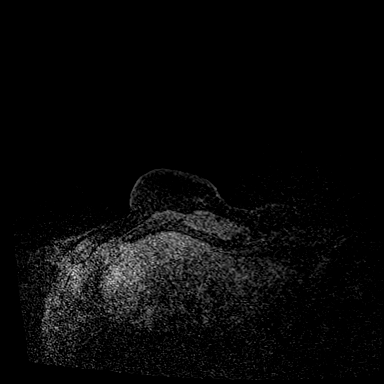

[35 of 48 positions shown; findings below may reference images not displayed]

Three-dimensional MR images were rendered by post-processing of the
original MR data on an independent workstation. The
three-dimensional MR images were interpreted, and findings are
reported in the following complete MRI report for this study. Three
dimensional images were evaluated at the independent DynaCad
workstation
FINDINGS: Breast composition: c. Heterogeneous fibroglandular tissue.

Background parenchymal enhancement: Moderate.

Right breast: No suspicious or discrete enhancement identified in
the region of the abnormality seen on the [DATE] MRI.

Ancillary findings:  None.
IMPRESSION: The 11 x 8 mm irregular masslike area of enhancement seen on the
[DATE] MRI cannot be discerned from background today. The
MRI biopsy was canceled. A six-month follow-up MRI is recommended.

RECOMMENDATION:
Recommend six-month follow-up breast MRI. Recommend continued annual
mammography next due in [DATE]

BI-RADS CATEGORY  2: Benign.

## 2021-04-25 MED ORDER — GADOBUTROL 1 MMOL/ML IV SOLN
5.0000 mL | Freq: Once | INTRAVENOUS | Status: AC | PRN
  Administered 2021-04-25: 5 mL via INTRAVENOUS

## 2021-05-03 ENCOUNTER — Other Ambulatory Visit: Payer: Self-pay

## 2021-05-03 ENCOUNTER — Ambulatory Visit
Admission: RE | Admit: 2021-05-03 | Discharge: 2021-05-03 | Disposition: A | Discharge status: Home / Self Care | Payer: BC Managed Care – PPO | Source: Ambulatory Visit | Attending: Family Medicine | Admitting: Family Medicine

## 2021-05-03 DIAGNOSIS — K769 Liver disease, unspecified: Secondary | ICD-10-CM

## 2021-05-03 IMAGING — MR MR ABDOMEN WO/W CM
11 of 17 series · 25 of 48 positions shown · IV contrast (multihance)
Comparison: MRI breast [DATE]

CLINICAL DATA: Further characterization of enhancing hepatic lesion
seen on prior breast MRI

EXAM:
MRI ABDOMEN WITHOUT AND WITH CONTRAST
TECHNIQUE: Multiplanar multisequence MR imaging of the abdomen was performed
both before and after the administration of intravenous contrast.
CONTRAST:  15mL MULTIHANCE GADOBENATE DIMEGLUMINE 529 MG/ML IV SOLN

[Series 3: T2 · coronal · 5.0mm · 1.56mm/px · 1 of 30 slices shown (1 of 3)]
[im 1/30]
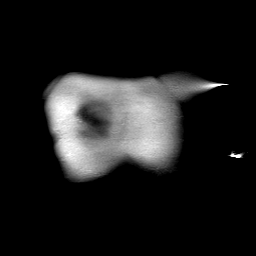

[Series 4: T2 · axial · 6.5mm · 0.74mm/px · 1 of 30 slices shown (2 of 3)]
[im 1/30]
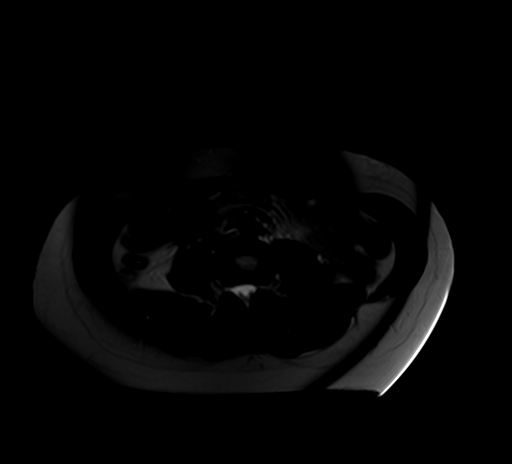

[Series 5: axial tru fisp · axial · 5.0mm · 1.41mm/px · 1 of 37 slices shown]
[im 1/37]
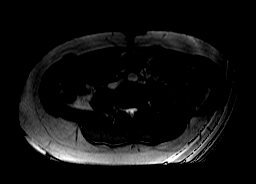

[Series 6: ep2d_[id number] · axial · 6.0mm · 1.98mm/px · z∈[-147,+79]mm · 2 of 90 slices shown]
[im 1/90]
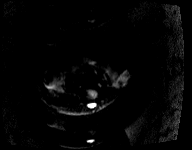
[im 90/90]
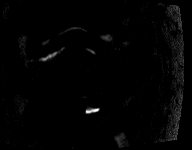

[Series 7: ep2d_[id number]_adc · axial · 6.0mm · 1.98mm/px · 1 of 29 slices shown]
[im 1/29]
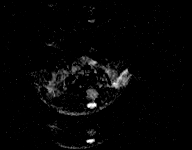

[Series 8: T2 · axial · 5.0mm · 1.37mm/px · 1 of 35 slices shown (3 of 3)]
[im 1/35]
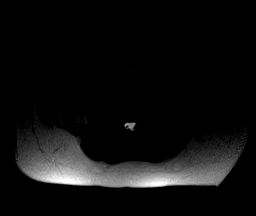

[Series 9: axial in out · axial · 6.0mm · 0.74mm/px · z∈[-155,+52]mm · 2 of 62 slices shown]
[im 1/62]
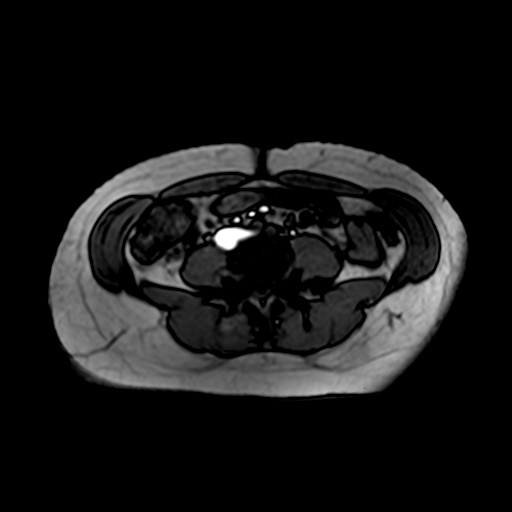
[im 62/62]
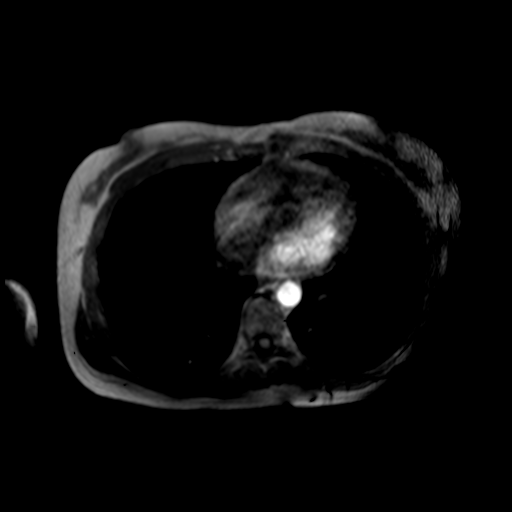

[Series 10: T1 dynamic · axial · non-contrast · 2.2mm · 0.78mm/px · z∈[-156,+53]mm · 4 of 96 slices shown]
[im 1/96]
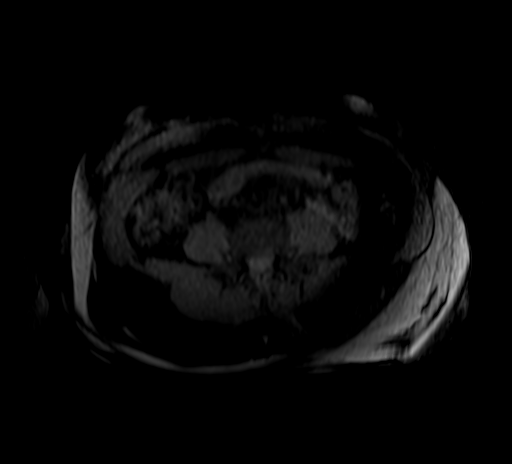
[im 32/96]
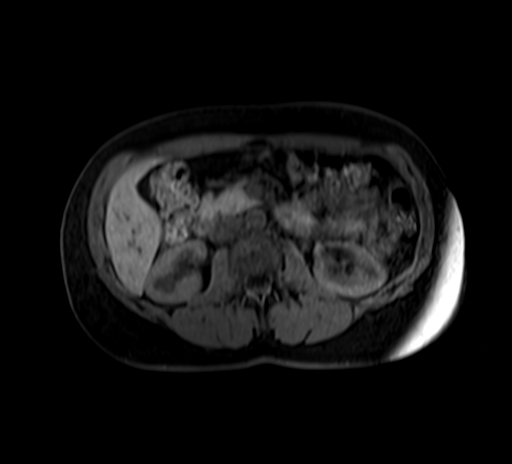
[im 64/96]
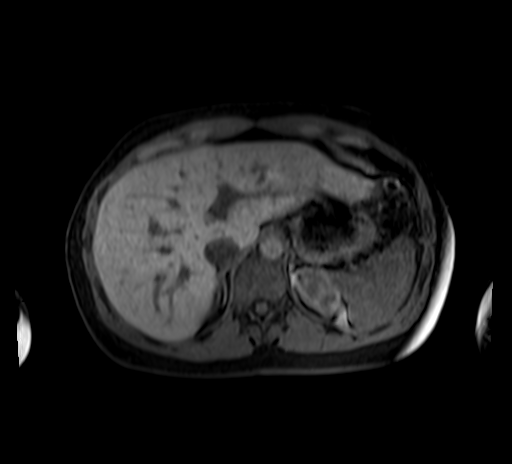
[im 96/96]
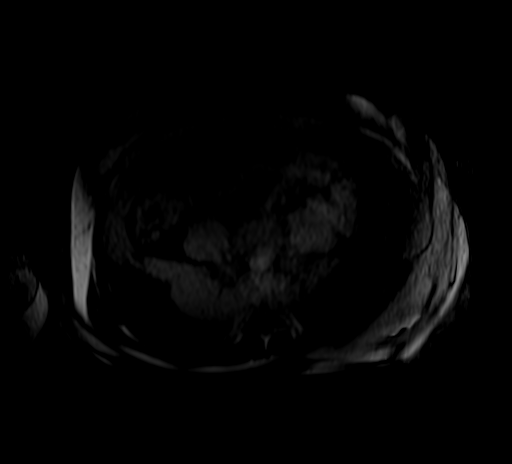

[Series 11: post 25 sec · axial · 2.2mm · 0.78mm/px · z∈[-156,+53]mm · 4 of 96 slices shown]
[im 1/96]
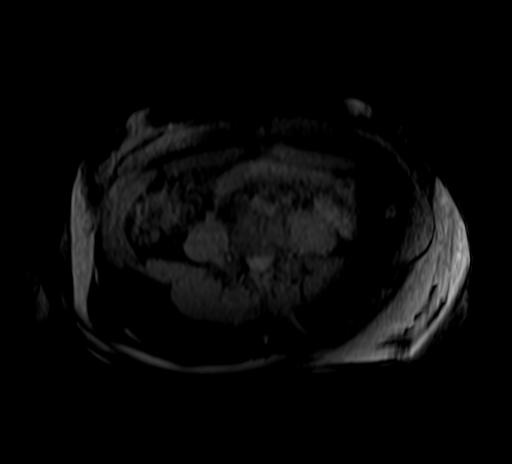
[im 32/96]
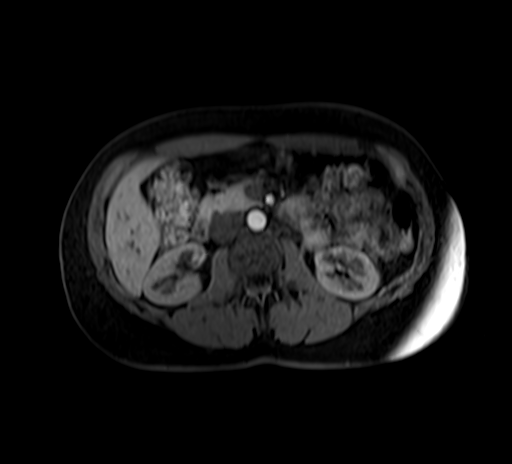
[im 64/96]
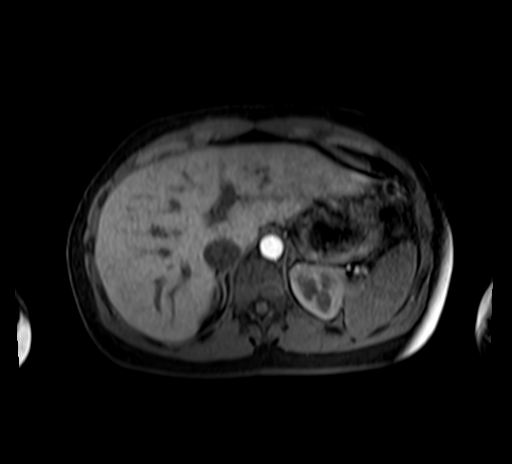
[im 96/96]
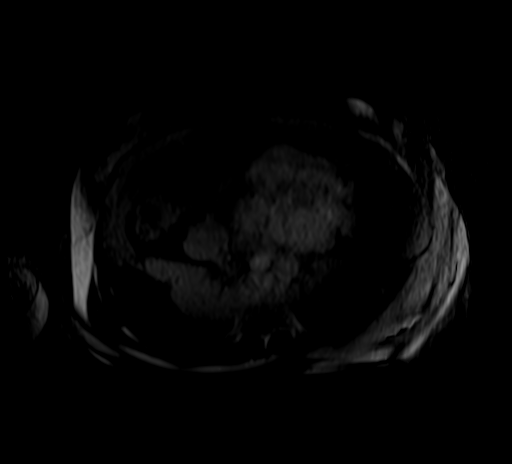

[Series 12: post 25 sec_sub · axial · 2.2mm · 0.78mm/px · z∈[-156,+53]mm · 4 of 96 slices shown]
[im 1/96]
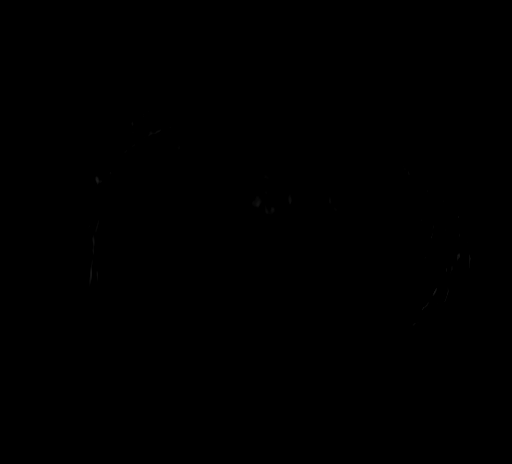
[im 32/96]
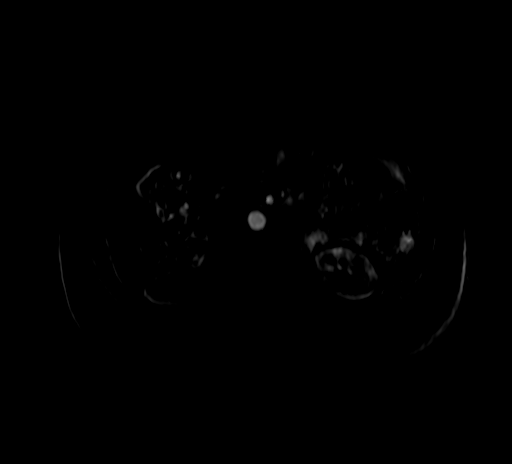
[im 64/96]
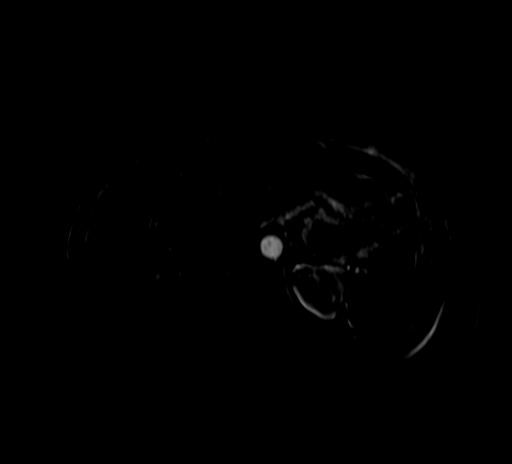
[im 96/96]
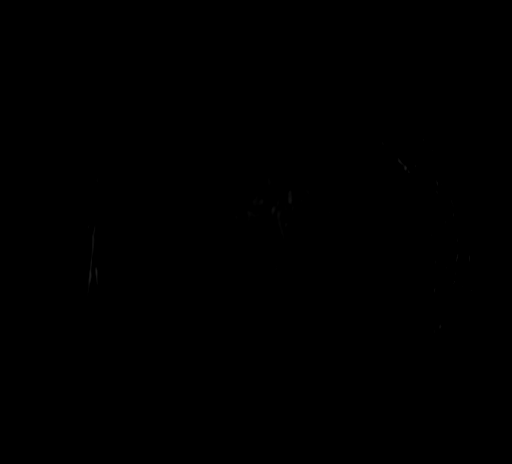

[Series 13: post 45 sec · axial · 2.2mm · 0.78mm/px · z∈[-156,+53]mm · 4 of 96 slices shown]
[im 1/96]
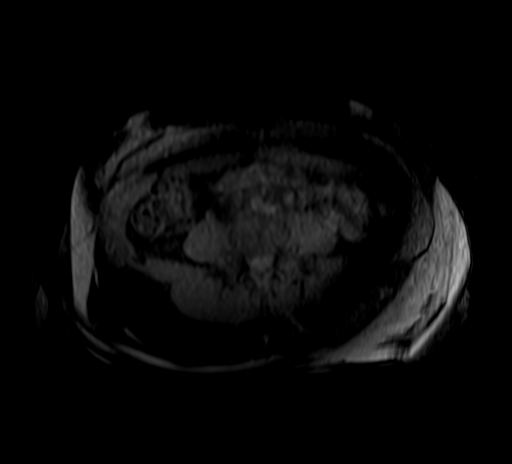
[im 32/96]
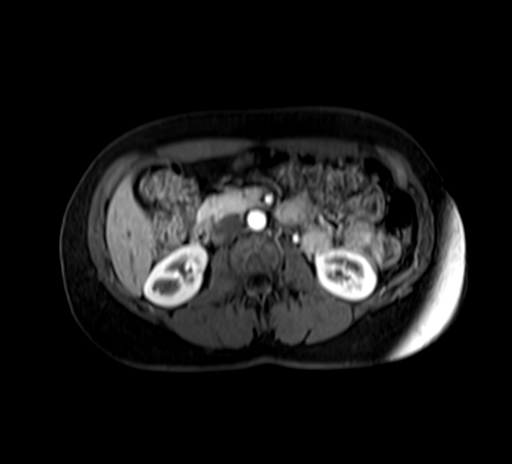
[im 64/96]
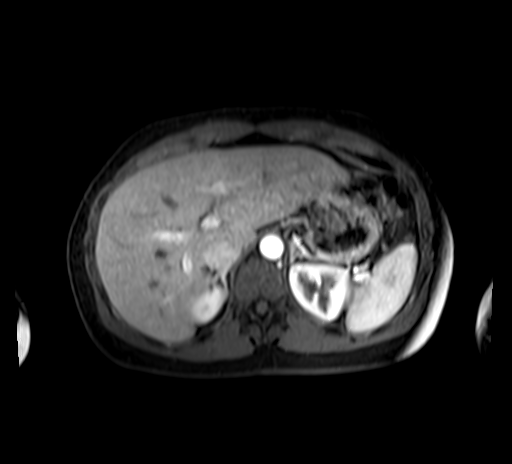
[im 96/96]
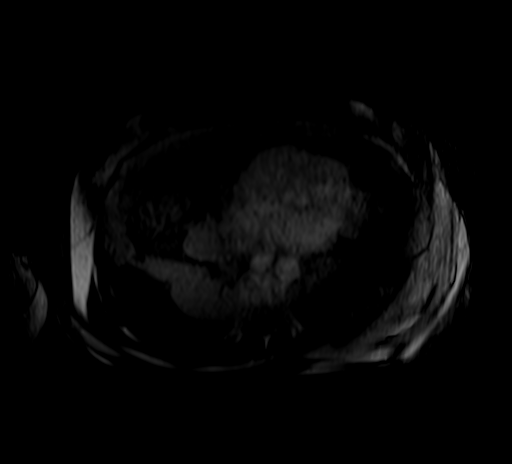

[25 of 48 positions shown; findings below may reference images not displayed]

FINDINGS: Lower chest: No acute abnormality.

Hepatobiliary: No hepatic steatosis. Well-circumscribed T2
hyperintense lesion in the left dome of the liver measures 11 mm on
image [DATE] demonstrating some late arterial peripheral nodular
discontinuous enhancement with filling in of contrast throughout the
lesion on delayed phases of postcontrast imaging intensity similar
to that of background aorta, consistent with a benign hepatic
hemangioma. No suspicious hepatic lesion. Gallbladder is
unremarkable. No biliary ductal dilation.

Pancreas: Intrinsic T1 signal of the pancreatic parenchyma is within
normal limits. No pancreatic ductal dilation or pancreatic divisum.
No cystic or solid arterially hyperenhancing pancreatic lesion
identified.

Spleen:  Within normal limits in size and appearance.

Adrenals/Urinary Tract: Bilateral adrenal glands are unremarkable.
No hydronephrosis. No solid enhancing renal mass.

Stomach/Bowel: Visualized portions within the abdomen are
unremarkable.

Vascular/Lymphatic: No pathologically enlarged lymph nodes
identified. No abdominal aortic aneurysm demonstrated.

Other:  None.

Musculoskeletal: No suspicious bone lesions identified.
IMPRESSION: 1. Benign 11 mm hepatic hemangioma in the left dome of the liver,
corresponding with the lesion seen on prior breast MRI.
2. No suspicious hepatic lesions.

## 2021-05-03 MED ORDER — GADOBENATE DIMEGLUMINE 529 MG/ML IV SOLN
15.0000 mL | Freq: Once | INTRAVENOUS | Status: AC | PRN
  Administered 2021-05-03: 15 mL via INTRAVENOUS

## 2021-06-02 ENCOUNTER — Other Ambulatory Visit: Payer: Self-pay | Admitting: Family Medicine

## 2021-06-02 DIAGNOSIS — N6313 Unspecified lump in the right breast, lower outer quadrant: Secondary | ICD-10-CM

## 2021-11-05 ENCOUNTER — Ambulatory Visit
Admission: RE | Admit: 2021-11-05 | Discharge: 2021-11-05 | Disposition: A | Discharge status: Home / Self Care | Payer: BC Managed Care – PPO | Source: Ambulatory Visit | Attending: Family Medicine | Admitting: Family Medicine

## 2021-11-05 DIAGNOSIS — N6313 Unspecified lump in the right breast, lower outer quadrant: Secondary | ICD-10-CM

## 2021-11-05 MED ORDER — GADOBUTROL 1 MMOL/ML IV SOLN
7.0000 mL | Freq: Once | INTRAVENOUS | Status: AC | PRN
  Administered 2021-11-05: 7 mL via INTRAVENOUS

## 2022-01-15 ENCOUNTER — Encounter: Payer: BC Managed Care – PPO | Admitting: Family Medicine

## 2022-01-24 ENCOUNTER — Encounter: Payer: Self-pay | Admitting: Family Medicine

## 2022-01-24 ENCOUNTER — Ambulatory Visit (INDEPENDENT_AMBULATORY_CARE_PROVIDER_SITE_OTHER): Payer: BC Managed Care – PPO | Admitting: Family Medicine

## 2022-01-24 ENCOUNTER — Ambulatory Visit: Payer: BC Managed Care – PPO | Attending: Family Medicine

## 2022-01-24 VITALS — BP 96/70 | HR 63 | Temp 97.7°F | Resp 15 | Ht 67.0 in | Wt 175.0 lb

## 2022-01-24 DIAGNOSIS — R002 Palpitations: Secondary | ICD-10-CM | POA: Diagnosis not present

## 2022-01-24 DIAGNOSIS — Z0001 Encounter for general adult medical examination with abnormal findings: Secondary | ICD-10-CM | POA: Diagnosis not present

## 2022-01-24 DIAGNOSIS — R011 Cardiac murmur, unspecified: Secondary | ICD-10-CM

## 2022-01-24 DIAGNOSIS — Z23 Encounter for immunization: Secondary | ICD-10-CM

## 2022-01-24 NOTE — Progress Notes (Signed)
Subjective:  Patient ID: Valerie Pennington, female    DOB: November 18, 1975  Age: 46 y.o. MRN: [id number]  Chief Complaint  Patient presents with   Annual Exam    HPI Well Adult Physical: Patient here for a comprehensive physical exam.The patient reports no problems Do you take any herbs or supplements that were not prescribed by a doctor? yes Are you taking calcium supplements? yes . She takes multivitamins One-A-Day for women Are you taking aspirin daily? no  Encounter for general adult medical examination without abnormal findings  Physical ("At Risk" items are starred): Patient's last physical exam was 1 year ago .  Patient is not afflicted from Stress Incontinence and Urge Incontinence  Patient exercise 40 minutes x 5 days. She does not smoke and she drinks alcohol socially. Patient wears a seat belt, has smoke detectors, has carbon monoxide detectors, practices appropriate gun safety, and wears sunscreen with extended sun exposure. Dental Care: biannual cleanings, brushes and flosses daily. Ophthalmology/Optometry: Annual visit.  Hearing loss: none Vision impairments: none  Menarche: 46 years old Menstrual History: regular LMP: 12/25/2021 Pregnancy history: G3P3 Safe at home: yes. She does not feel physically threatened by other or anyone is not hurting her.  Self breast exams: monthly  [place] [facility] Visit from 01/24/2022 in [facility]  PHQ-2 Total Score 0       Social Hx   Social History   Socioeconomic History   Marital status: Married    Spouse name: Not on file   Number of children: 2   Years of education: Not on file   Highest education level: Not on file  Occupational History   Occupation: Teacher  Tobacco Use   Smoking status: Never   Smokeless tobacco: Never  Vaping Use   Vaping Use: Never used  Substance and Sexual Activity   Alcohol use: Yes    Alcohol/week: 2.0 standard drinks of alcohol    Types: 1 Glasses of wine, 1 Cans of beer  per week    Comment: socially   Drug use: Never   Sexual activity: Yes    Partners: Male    Birth control/protection: Surgical  Other Topics Concern   Not on file  Social History Narrative   Not on file   Social Determinants of Health   Financial Resource Strain: Low Risk  (01/24/2022)   Overall Financial Resource Strain (CARDIA)    Difficulty of Paying Living Expenses: Not hard at all  Food Insecurity: No Food Insecurity (01/24/2022)   Hunger Vital Sign    Worried About Running Out of Food in the Last Year: Never true    [place] in the Last Year: Never true  Transportation Needs: No Transportation Needs (01/24/2022)   PRAPARE - Hydrologist (Medical): No    Lack of Transportation (Non-Medical): No  Physical Activity: Sufficiently Active (01/24/2022)   Exercise Vital Sign    Days of Exercise per Week: 5 days    Minutes of Exercise per Session: 40 min  Stress: No Stress Concern Present (01/24/2022)   [place]    Feeling of Stress : Not at all  Social Connections: Unknown (01/24/2022)   Social Connection and Isolation Panel [NHANES]    Frequency of Communication with Friends and Family: More than three times a week    Frequency of Social Gatherings with Friends and Family: More than three times a week    Attends Religious  Services: Not on file    Active Member of Clubs or Organizations: Not on file    Attends Club or Organization Meetings: Not on file    Marital Status: Married   History reviewed. No pertinent past medical history. Past Surgical History:  Procedure Laterality Date   BREAST LUMPECTOMY Right 10/2020    Family History  Problem Relation Age of Onset   Breast cancer Mother 75   Diabetes Father    DiGeorge syndrome Child     Review of Systems  Constitutional:  Negative for chills, fatigue and fever.  HENT:  Negative for congestion, ear pain and sore throat.    Respiratory:  Negative for cough and shortness of breath.   Cardiovascular:  Negative for chest pain and palpitations.  Gastrointestinal:  Negative for abdominal pain, constipation, diarrhea, nausea and vomiting.  Endocrine: Negative for polydipsia, polyphagia and polyuria.  Genitourinary:  Negative for difficulty urinating and dysuria.  Musculoskeletal:  Negative for arthralgias, back pain and myalgias.  Skin:  Negative for rash.  Neurological:  Negative for headaches.  Psychiatric/Behavioral:  Negative for dysphoric mood. The patient is not nervous/anxious.      Objective:  BP 96/70 (BP Location: Right Arm, Cuff Size: Normal)   Pulse 63   Temp 97.7 F (36.5 C)   Resp 15   Ht 5\' 7"  (1.702 m)   Wt 175 lb (79.4 kg)   LMP 12/25/2021 (Approximate)   SpO2 98%   BMI 27.41 kg/m      01/24/2022   10:47 AM 01/24/2022    9:39 AM 02/01/2021    8:57 AM  BP/Weight  Systolic BP 96 86 110  Diastolic BP 70 60 70  Wt. (Lbs)  175 166  BMI  27.41 kg/m2 26 kg/m2    Physical Exam Vitals reviewed.  Constitutional:      General: She is not in acute distress.    Appearance: Normal appearance. She is normal weight.  HENT:     Right Ear: Tympanic membrane and ear canal normal.     Left Ear: There is impacted cerumen.     Nose: Nose normal. No congestion or rhinorrhea.     Mouth/Throat:     Pharynx: No oropharyngeal exudate or posterior oropharyngeal erythema.  Eyes:     Conjunctiva/sclera: Conjunctivae normal.  Neck:     Thyroid: No thyroid mass.     Vascular: No carotid bruit.  Cardiovascular:     Rate and Rhythm: Normal rate and regular rhythm.     Pulses: Normal pulses.     Heart sounds: Murmur heard.  Pulmonary:     Effort: Pulmonary effort is normal.     Breath sounds: Normal breath sounds.  Abdominal:     General: Bowel sounds are normal.     Palpations: Abdomen is soft. There is no mass.     Tenderness: There is no abdominal tenderness.  Lymphadenopathy:     Cervical:  No cervical adenopathy.  Skin:    General: Skin is warm and dry.  Neurological:     Mental Status: She is alert and oriented to person, place, and time.  Psychiatric:        Mood and Affect: Mood normal.        Behavior: Behavior normal.     Lab Results  Component Value Date   WBC 7.4 01/24/2022   HGB 14.8 01/24/2022   HCT 43.9 01/24/2022   PLT 265 01/24/2022   GLUCOSE 74 01/24/2022   CHOL 190 01/24/2022  TRIG 95 01/24/2022   HDL 68 01/24/2022   LDLCALC 105 (H) 01/24/2022   ALT 13 01/24/2022   AST 13 01/24/2022   NA 140 01/24/2022   K 4.5 01/24/2022   CL 102 01/24/2022   CREATININE 0.82 01/24/2022   BUN 11 01/24/2022   CO2 21 01/24/2022   TSH 5.050 (H) 01/24/2022      Assessment & Plan:   Problem List Items Addressed This Visit       Other   Encounter for general adult medical examination with abnormal findings - Primary    Education given.      Relevant Orders   Comprehensive metabolic panel (Completed)   Lipid panel (Completed)   CBC with Differential/Platelet (Completed)   TSH (Completed)   ECHOCARDIOGRAM COMPLETE   Murmur    Check echo.      Relevant Orders   ECHOCARDIOGRAM COMPLETE   EKG 12-Lead (Completed)   Cardiovascular Risk Assessment (Completed)   Palpitations    EKG NSR. No ST changes. Check holtor monitor.       Relevant Orders   LONG TERM MONITOR (3-14 DAYS)   Other Visit Diagnoses     Need for immunization against influenza       Relevant Orders   Flu Vaccine QUAD 75mo+IM (Fluarix, Fluzone & Alfiuria Quad PF) (Completed)       Body mass index is 27.41 kg/m.   This is a list of the screening recommended for you and due dates:  Health Maintenance  Topic Date Due   HIV Screening  Never done   Hepatitis C Screening: USPSTF Recommendation to screen - Ages 39-79 yo.  Never done   Pap Smear  Never done   Tetanus Vaccine  11/14/2024   Flu Shot  Completed   COVID-19 Vaccine  Completed   HPV Vaccine  Aged Out      Follow-up: Return in about 1 year (around 01/25/2023) for cpe fasting.  An After Visit Summary was printed and given to the patient.  Blane Ohara, MD [facility] [phone]

## 2022-01-24 NOTE — Progress Notes (Unsigned)
ZIO XT mailed to pt's home address.   DOD to read 

## 2022-01-24 NOTE — Patient Instructions (Signed)

## 2022-01-25 LAB — COMPREHENSIVE METABOLIC PANEL
ALT: 13 IU/L (ref 0–32)
AST: 13 IU/L (ref 0–40)
Albumin/Globulin Ratio: 2.2 (ref 1.2–2.2)
Albumin: 4.6 g/dL (ref 3.9–4.9)
Alkaline Phosphatase: 58 IU/L (ref 44–121)
BUN/Creatinine Ratio: 13 (ref 9–23)
BUN: 11 mg/dL (ref 6–24)
Bilirubin Total: 0.4 mg/dL (ref 0.0–1.2)
CO2: 21 mmol/L (ref 20–29)
Calcium: 9.9 mg/dL (ref 8.7–10.2)
Chloride: 102 mmol/L (ref 96–106)
Creatinine, Ser: 0.82 mg/dL (ref 0.57–1.00)
Globulin, Total: 2.1 g/dL (ref 1.5–4.5)
Glucose: 74 mg/dL (ref 70–99)
Potassium: 4.5 mmol/L (ref 3.5–5.2)
Sodium: 140 mmol/L (ref 134–144)
Total Protein: 6.7 g/dL (ref 6.0–8.5)
eGFR: 90 mL/min/{1.73_m2} (ref >59)

## 2022-01-25 LAB — LIPID PANEL
Chol/HDL Ratio: 2.8 ratio (ref 0.0–4.4)
Cholesterol, Total: 190 mg/dL (ref 100–199)
HDL: 68 mg/dL (ref >39)
LDL Chol Calc (NIH): 105 mg/dL — ABNORMAL HIGH (ref 0–99)
Triglycerides: 95 mg/dL (ref 0–149)
VLDL Cholesterol Cal: 17 mg/dL (ref 5–40)

## 2022-01-25 LAB — CBC WITH DIFFERENTIAL/PLATELET
Basophils Absolute: 0.1 10*3/uL (ref 0.0–0.2)
Basos: 1 %
EOS (ABSOLUTE): 0.2 10*3/uL (ref 0.0–0.4)
Eos: 3 %
Hematocrit: 43.9 % (ref 34.0–46.6)
Hemoglobin: 14.8 g/dL (ref 11.1–15.9)
Immature Grans (Abs): 0 10*3/uL (ref 0.0–0.1)
Immature Granulocytes: 0 %
Lymphocytes Absolute: 2 10*3/uL (ref 0.7–3.1)
Lymphs: 27 %
MCH: 31.4 pg (ref 26.6–33.0)
MCHC: 33.7 g/dL (ref 31.5–35.7)
MCV: 93 fL (ref 79–97)
Monocytes Absolute: 0.9 10*3/uL (ref 0.1–0.9)
Monocytes: 12 %
Neutrophils Absolute: 4.3 10*3/uL (ref 1.4–7.0)
Neutrophils: 57 %
Platelets: 265 10*3/uL (ref 150–450)
RBC: 4.72 x10E6/uL (ref 3.77–5.28)
RDW: 12.2 % (ref 11.7–15.4)
WBC: 7.4 10*3/uL (ref 3.4–10.8)

## 2022-01-25 LAB — CARDIOVASCULAR RISK ASSESSMENT

## 2022-01-25 LAB — TSH: TSH: 5.05 u[IU]/mL — ABNORMAL HIGH (ref 0.450–4.500)

## 2022-01-26 DIAGNOSIS — R011 Cardiac murmur, unspecified: Secondary | ICD-10-CM | POA: Insufficient documentation

## 2022-01-26 DIAGNOSIS — R002 Palpitations: Secondary | ICD-10-CM | POA: Insufficient documentation

## 2022-01-26 DIAGNOSIS — Z0001 Encounter for general adult medical examination with abnormal findings: Secondary | ICD-10-CM | POA: Insufficient documentation

## 2022-01-26 NOTE — Assessment & Plan Note (Signed)
Education given. 

## 2022-01-26 NOTE — Assessment & Plan Note (Signed)
EKG NSR. No ST changes. Check holtor monitor.

## 2022-01-26 NOTE — Assessment & Plan Note (Signed)
Check echo 

## 2022-01-27 ENCOUNTER — Other Ambulatory Visit: Payer: Self-pay

## 2022-01-28 DIAGNOSIS — R002 Palpitations: Secondary | ICD-10-CM

## 2022-01-28 LAB — T4, FREE: Free T4: 1.24 ng/dL (ref 0.82–1.77)

## 2022-01-28 LAB — SPECIMEN STATUS REPORT

## 2022-01-30 ENCOUNTER — Other Ambulatory Visit: Payer: Self-pay

## 2022-03-07 LAB — HM MAMMOGRAPHY

## 2023-01-29 NOTE — Progress Notes (Unsigned)
Subjective:  Patient ID: Valerie Pennington, female    DOB: 10/20/1975  Age: 47 y.o. MRN: [id number]  Chief Complaint  Patient presents with   Annual Exam    HPI Well Adult Physical: Patient here for a comprehensive physical exam.The patient reports no problems Do you take any herbs or supplements that were not prescribed by a doctor? no Are you taking calcium supplements? no Are you taking aspirin daily? no  Encounter for general adult medical examination without abnormal findings  Physical ("At Risk" items are starred): Patient's last physical exam was 1 year ago .  Patient is not afflicted from Stress Incontinence and Urge Incontinence  Patient wears a seat belts Patient has smoke detectors and has carbon monoxide detectors. Patient practices appropriate gun safety. Patient wears sunscreen with extended sun exposure. Dental Care: biannual cleanings, brushes and flosses daily. Ophthalmology/Optometry: Annual visit.  Hearing loss: none Vision impairments: glasses Menarche: 47 y.o Menstrual History: reg LMP: 01/15/2023 Pregnancy history: 3 ([id number]) Safe at home: yes Self breast exams: yes  Gets Mammogram and PAP with Dr. Lollie Marrow.     01/30/2023    8:53 AM 01/24/2022    9:41 AM 01/11/2021   10:04 AM 06/05/2020    4:24 PM  Depression screen PHQ 2/9  Decreased Interest 0 0 0 0  Down, Depressed, Hopeless 0 0 0 0  PHQ - 2 Score 0 0 0 0         06/05/2020    4:25 PM 01/11/2021   10:04 AM 01/30/2023    8:53 AM  Fall Risk  Falls in the past year? 0 0 0  Was there an injury with Fall? 0 0 0  Fall Risk Category Calculator 0 0 0  Fall Risk Category (Retired) Low Low   (RETIRED) Patient Fall Risk Level Low fall risk Low fall risk   Patient at Risk for Falls Due to  No Fall Risks No Fall Risks  Fall risk Follow up  Falls evaluation completed Falls evaluation completed             Social Hx   Social History   Socioeconomic History   Marital status:  Married    Spouse name: Not on file   Number of children: 2   Years of education: Not on file   Highest education level: Not on file  Occupational History   Occupation: Teacher  Tobacco Use   Smoking status: Never   Smokeless tobacco: Never  Vaping Use   Vaping status: Never Used  Substance and Sexual Activity   Alcohol use: Yes    Alcohol/week: 2.0 standard drinks of alcohol    Types: 1 Glasses of wine, 1 Cans of beer per week    Comment: socially   Drug use: Never   Sexual activity: Yes    Partners: Male    Birth control/protection: Surgical  Other Topics Concern   Not on file  Social History Narrative   Not on file   Social Determinants of Health   Financial Resource Strain: Low Risk  (01/24/2022)   Overall Financial Resource Strain (CARDIA)    Difficulty of Paying Living Expenses: Not hard at all  Food Insecurity: No Food Insecurity (01/24/2022)   Hunger Vital Sign    Worried About Running Out of Food in the Last Year: Never true    Ran Out of Food in the Last Year: Never true  Transportation Needs: No Transportation Needs (01/24/2022)   PRAPARE - Transportation    Lack  of Transportation (Medical): No    Lack of Transportation (Non-Medical): No  Physical Activity: Sufficiently Active (01/24/2022)   Exercise Vital Sign    Days of Exercise per Week: 5 days    Minutes of Exercise per Session: 40 min  Stress: No Stress Concern Present (01/24/2022)   [employer] of Occupational Health - Occupational Stress Questionnaire    Feeling of Stress : Not at all  Social Connections: Moderately Integrated (01/30/2023)   Social Connection and Isolation Panel [NHANES]    Frequency of Communication with Friends and Family: More than three times a week    Frequency of Social Gatherings with Friends and Family: More than three times a week    Attends Religious Services: More than 4 times per year    Active Member of [employer] or Organizations: No    Attends Banker  Meetings: Never    Marital Status: Married   History reviewed. No pertinent past medical history. Past Surgical History:  Procedure Laterality Date   BREAST LUMPECTOMY Right 10/2020    Family History  Problem Relation Age of Onset   Breast cancer Mother 74   Diabetes Father    DiGeorge syndrome Child     Review of Systems  Constitutional:  Negative for chills, fatigue and fever.  HENT:  Negative for congestion, ear pain, rhinorrhea and sore throat.   Respiratory:  Negative for cough and shortness of breath.   Cardiovascular:  Negative for chest pain.  Gastrointestinal:  Negative for abdominal pain, constipation, diarrhea, nausea and vomiting.  Genitourinary:  Negative for dysuria and urgency.  Musculoskeletal:  Negative for back pain and myalgias.  Neurological:  Negative for dizziness, weakness, light-headedness and headaches.  Psychiatric/Behavioral:  Negative for dysphoric mood. The patient is not nervous/anxious.      Objective:  BP 110/74   Pulse 84   Temp (!) 97.1 F (36.2 C)   Ht 5\' 6"  (1.676 m)   Wt 190 lb (86.2 kg)   SpO2 98%   BMI 30.67 kg/m      01/30/2023    8:51 AM 01/24/2022   10:47 AM 01/24/2022    9:39 AM  BP/Weight  Systolic BP 110 96 86  Diastolic BP 74 70 60  Wt. (Lbs) 190  175  BMI 30.67 kg/m2  27.41 kg/m2    Physical Exam Vitals reviewed.  Constitutional:      General: She is not in acute distress.    Appearance: Normal appearance. She is normal weight.  HENT:     Right Ear: Tympanic membrane and ear canal normal.     Left Ear: There is impacted cerumen.     Nose: Nose normal. No congestion or rhinorrhea.  Eyes:     Conjunctiva/sclera: Conjunctivae normal.  Neck:     Thyroid: No thyroid mass.     Vascular: No carotid bruit.  Cardiovascular:     Rate and Rhythm: Normal rate and regular rhythm.     Pulses: Normal pulses.     Heart sounds: Normal heart sounds. No murmur heard. Pulmonary:     Effort: Pulmonary effort is normal. No  respiratory distress.     Breath sounds: Normal breath sounds.  Abdominal:     General: Abdomen is flat. Bowel sounds are normal.     Palpations: Abdomen is soft. There is no mass.     Tenderness: There is no abdominal tenderness.  Musculoskeletal:        General: Normal range of motion.  Lymphadenopathy:  Cervical: No cervical adenopathy.  Skin:    General: Skin is warm and dry.  Neurological:     Mental Status: She is alert and oriented to person, place, and time.     Cranial Nerves: No cranial nerve deficit.  Psychiatric:        Mood and Affect: Mood normal.        Behavior: Behavior normal.     Lab Results  Component Value Date   WBC 6.2 01/30/2023   HGB 15.9 01/30/2023   HCT 47.5 (H) 01/30/2023   PLT 294 01/30/2023   GLUCOSE 84 01/30/2023   CHOL 210 (H) 01/30/2023   TRIG 74 01/30/2023   HDL 78 01/30/2023   LDLCALC 119 (H) 01/30/2023   ALT 15 01/30/2023   AST 17 01/30/2023   NA 137 01/30/2023   K 4.9 01/30/2023   CL 99 01/30/2023   CREATININE 0.82 01/30/2023   BUN 12 01/30/2023   CO2 21 01/30/2023   TSH 3.360 01/30/2023      Assessment & Plan:  Screening for colon cancer -     Cologuard; Future  Routine medical exam Assessment & Plan: Things to do to keep yourself healthy  - Exercise at least 30-45 minutes a day, 3-4 days a week.  - Eat a low-fat diet with lots of fruits and vegetables, up to 7-9 servings per day.  - Seatbelts can save your life. Wear them always.  - Smoke detectors on every level of your home, check batteries every year.  - Eye Doctor - have an eye exam every 1-2 years  - Safe sex - if you may be exposed to STDs, use a condom.  - Alcohol -  If you drink, do it moderately, less than 2 drinks per day.  - Health Care Power of Attorney. Choose someone to speak for you if you are not able.  - Depression is common in our stressful world.If you're feeling down or losing interest in things you normally enjoy, please come in for a visit.  -  Violence - If anyone is threatening or hurting you, please call immediately.  Orders: -     CBC with Differential/Platelet -     Comprehensive metabolic panel -     Lipid panel -     TSH  Encounter for hepatitis C screening test for low risk patient -     HCV Ab w Reflex to Quant PCR  Encounter for screening for HIV -     HIV Antibody (routine testing w rflx)  Other orders -     Interpretation:     Body mass index is 30.67 kg/m.    This is a list of the screening recommended for you and due dates:  Health Maintenance  Topic Date Due   Pap with HPV screening  Never done   Colon Cancer Screening  Never done   DTaP/Tdap/Td vaccine (2 - Td or Tdap) 11/14/2024   Flu Shot  Completed   COVID-19 Vaccine  Completed   Hepatitis C Screening  Completed   HIV Screening  Completed   HPV Vaccine  Aged Out     No orders of the defined types were placed in this encounter.   Follow-up: Return in about 1 year (around 01/30/2024) for cpe.  An After Visit Summary was printed and given to the patient.   Clayborn Bigness I Leal-Borjas,acting as a scribe for Blane Ohara, MD.,have documented all relevant documentation on the behalf of Blane Ohara, MD,as directed by  Fritzi Mandes  Kairy Folsom, MD while in the presence of Blane Ohara, MD.    Blane Ohara, MD [facility] [phone]

## 2023-01-30 ENCOUNTER — Ambulatory Visit (INDEPENDENT_AMBULATORY_CARE_PROVIDER_SITE_OTHER): Payer: BC Managed Care – PPO | Admitting: Family Medicine

## 2023-01-30 ENCOUNTER — Encounter: Payer: Self-pay | Admitting: Family Medicine

## 2023-01-30 VITALS — BP 110/74 | HR 84 | Temp 97.1°F | Ht 66.0 in | Wt 190.0 lb

## 2023-01-30 DIAGNOSIS — Z1211 Encounter for screening for malignant neoplasm of colon: Secondary | ICD-10-CM | POA: Diagnosis not present

## 2023-01-30 DIAGNOSIS — Z0000 Encounter for general adult medical examination without abnormal findings: Secondary | ICD-10-CM | POA: Diagnosis not present

## 2023-01-30 DIAGNOSIS — Z114 Encounter for screening for human immunodeficiency virus [HIV]: Secondary | ICD-10-CM | POA: Diagnosis not present

## 2023-01-30 DIAGNOSIS — Z1159 Encounter for screening for other viral diseases: Secondary | ICD-10-CM | POA: Diagnosis not present

## 2023-01-30 LAB — HM PAP SMEAR: HM Pap smear: NORMAL

## 2023-01-30 NOTE — Patient Instructions (Addendum)

## 2023-01-31 DIAGNOSIS — Z0000 Encounter for general adult medical examination without abnormal findings: Secondary | ICD-10-CM | POA: Insufficient documentation

## 2023-01-31 LAB — COMPREHENSIVE METABOLIC PANEL
ALT: 15 [IU]/L (ref 0–32)
AST: 17 [IU]/L (ref 0–40)
Albumin: 4.8 g/dL (ref 3.9–4.9)
Alkaline Phosphatase: 73 [IU]/L (ref 44–121)
BUN/Creatinine Ratio: 15 (ref 9–23)
BUN: 12 mg/dL (ref 6–24)
Bilirubin Total: 0.6 mg/dL (ref 0.0–1.2)
CO2: 21 mmol/L (ref 20–29)
Calcium: 9.5 mg/dL (ref 8.7–10.2)
Chloride: 99 mmol/L (ref 96–106)
Creatinine, Ser: 0.82 mg/dL (ref 0.57–1.00)
Globulin, Total: 2.7 g/dL (ref 1.5–4.5)
Glucose: 84 mg/dL (ref 70–99)
Potassium: 4.9 mmol/L (ref 3.5–5.2)
Sodium: 137 mmol/L (ref 134–144)
Total Protein: 7.5 g/dL (ref 6.0–8.5)
eGFR: 90 mL/min/{1.73_m2} (ref >59)

## 2023-01-31 LAB — LIPID PANEL
Chol/HDL Ratio: 2.7 {ratio} (ref 0.0–4.4)
Cholesterol, Total: 210 mg/dL — ABNORMAL HIGH (ref 100–199)
HDL: 78 mg/dL (ref >39)
LDL Chol Calc (NIH): 119 mg/dL — ABNORMAL HIGH (ref 0–99)
Triglycerides: 74 mg/dL (ref 0–149)
VLDL Cholesterol Cal: 13 mg/dL (ref 5–40)

## 2023-01-31 LAB — CBC WITH DIFFERENTIAL/PLATELET
Basophils Absolute: 0 10*3/uL (ref 0.0–0.2)
Basos: 1 %
EOS (ABSOLUTE): 0.1 10*3/uL (ref 0.0–0.4)
Eos: 2 %
Hematocrit: 47.5 % — ABNORMAL HIGH (ref 34.0–46.6)
Hemoglobin: 15.9 g/dL (ref 11.1–15.9)
Immature Grans (Abs): 0 10*3/uL (ref 0.0–0.1)
Immature Granulocytes: 1 %
Lymphocytes Absolute: 1.2 10*3/uL (ref 0.7–3.1)
Lymphs: 19 %
MCH: 31.4 pg (ref 26.6–33.0)
MCHC: 33.5 g/dL (ref 31.5–35.7)
MCV: 94 fL (ref 79–97)
Monocytes Absolute: 0.7 10*3/uL (ref 0.1–0.9)
Monocytes: 11 %
Neutrophils Absolute: 4.1 10*3/uL (ref 1.4–7.0)
Neutrophils: 66 %
Platelets: 294 10*3/uL (ref 150–450)
RBC: 5.06 x10E6/uL (ref 3.77–5.28)
RDW: 12.2 % (ref 11.7–15.4)
WBC: 6.2 10*3/uL (ref 3.4–10.8)

## 2023-01-31 LAB — HCV INTERPRETATION

## 2023-01-31 LAB — TSH: TSH: 3.36 u[IU]/mL (ref 0.450–4.500)

## 2023-01-31 LAB — HCV AB W REFLEX TO QUANT PCR: HCV Ab: NONREACTIVE

## 2023-01-31 LAB — HIV ANTIBODY (ROUTINE TESTING W REFLEX): HIV Screen 4th Generation wRfx: NONREACTIVE

## 2023-01-31 NOTE — Assessment & Plan Note (Signed)

## 2023-02-02 ENCOUNTER — Telehealth: Payer: Self-pay

## 2023-02-02 ENCOUNTER — Other Ambulatory Visit: Payer: Self-pay | Admitting: Family Medicine

## 2023-02-02 ENCOUNTER — Other Ambulatory Visit: Payer: Self-pay

## 2023-02-02 DIAGNOSIS — Z1211 Encounter for screening for malignant neoplasm of colon: Secondary | ICD-10-CM

## 2023-02-02 MED ORDER — ATORVASTATIN CALCIUM 10 MG PO TABS: 10.0000 mg | ORAL_TABLET | Freq: Every day | ORAL | 0 refills | Status: DC

## 2023-02-02 NOTE — Telephone Encounter (Signed)
Patient called and stated that she has decided she wants to do the Cologuard and was wanting to know if you can order for her please!

## 2023-02-02 NOTE — Telephone Encounter (Signed)
Ordered. Dr Sedalia Muta

## 2023-02-05 ENCOUNTER — Other Ambulatory Visit: Payer: Self-pay

## 2023-02-05 DIAGNOSIS — Z1211 Encounter for screening for malignant neoplasm of colon: Secondary | ICD-10-CM

## 2023-02-28 LAB — COLOGUARD: COLOGUARD: NEGATIVE

## 2023-04-28 ENCOUNTER — Other Ambulatory Visit: Payer: Self-pay | Admitting: Family Medicine

## 2023-04-28 ENCOUNTER — Other Ambulatory Visit: Payer: Self-pay

## 2023-04-28 NOTE — Telephone Encounter (Signed)
 Copied from CRM [id number]. Topic: Clinical - Medication Refill >> Apr 28, 2023 11:45 AM Kinnie DEL wrote: Most Recent Primary Care Visit:  Provider: [facility]  Department: [facility]  Visit Type: PHYSICAL  Date: 01/30/2023  Medication: atorvastatin  (LIPITOR) 10 MG tablet   Has the patient contacted their pharmacy? Yes (Agent: If no, request that the patient contact the pharmacy for the refill. If patient does not wish to contact the pharmacy document the reason why and proceed with request.) (Agent: If yes, when and what did the pharmacy advise?)  Is this the correct pharmacy for this prescription? Yes If no, delete pharmacy and type the correct one.  This is the patient's preferred pharmacy:  [id number] - [place], [place] - [address] AT [facility] [address] [place] [address] Phone: [phone] Fax: [phone]   Has the prescription been filled recently? No  Is the patient out of the medication? No  Has the patient been seen for an appointment in the last year OR does the patient have an upcoming appointment? No  Can we respond through MyChart? No  Agent: Please be advised that Rx refills may take up to 3 business days. We ask that you follow-up with your pharmacy.

## 2023-07-26 ENCOUNTER — Other Ambulatory Visit: Payer: Self-pay

## 2023-10-21 ENCOUNTER — Other Ambulatory Visit: Payer: Self-pay

## 2024-01-17 ENCOUNTER — Other Ambulatory Visit: Payer: Self-pay

## 2024-02-01 NOTE — Progress Notes (Signed)
 Subjective:  Patient ID: Valerie Pennington Signs, female    DOB: 02-Jun-1975  Age: 48 y.o. MRN: [id number]  No chief complaint on file.  Abstract from gynecology note in media.   Well Adult Physical: Patient here for a comprehensive physical exam.The patient reports {problems:16946} Do you take any herbs or supplements that were not prescribed by a doctor? {yes/no/not asked:9010} Are you taking calcium  supplements? {yes/no:63} Are you taking aspirin daily? {yes/no:63}  Encounter for general adult medical examination without abnormal findings  Physical (At Risk items are starred): Patient's last physical exam was 1 year ago .  Patient is not afflicted from Stress Incontinence and Urge Incontinence  Patient wears a seat belts Patient has smoke detectors and has carbon monoxide detectors. Patient practices appropriate gun safety. Patient wears sunscreen with extended sun exposure. Dental Care: biannual cleanings, brushes and flosses daily. Ophthalmology/Optometry: Annual visit.  Hearing loss: none Vision impairments: none  Menarche: *** Menstrual History: *** LMP: *** Pregnancy history: *** Safe at home: *** Self breast exams: ***     01/30/2023    8:53 AM 01/24/2022    9:41 AM 01/11/2021   10:04 AM 06/05/2020    4:24 PM  Depression screen PHQ 2/9  Decreased Interest 0 0 0 0  Down, Depressed, Hopeless 0 0 0 0  PHQ - 2 Score 0 0 0 0         06/05/2020    4:25 PM 01/11/2021   10:04 AM 01/30/2023    8:53 AM  Fall Risk  Falls in the past year? 0 0 0  Was there an injury with Fall? 0 0 0  Fall Risk Category Calculator 0 0 0  Fall Risk Category (Retired) Low  Low    (RETIRED) Patient Fall Risk Level Low fall risk  Low fall risk    Patient at Risk for Falls Due to  No Fall Risks No Fall Risks  Fall risk Follow up  Falls evaluation completed  Falls evaluation completed     Data saved with a previous flowsheet row definition             Social Hx   Social History    Socioeconomic History   Marital status: Married    Spouse name: Not on file   Number of children: 2   Years of education: Not on file   Highest education level: Not on file  Occupational History   Occupation: Teacher  Tobacco Use   Smoking status: Never   Smokeless tobacco: Never  Vaping Use   Vaping status: Never Used  Substance and Sexual Activity   Alcohol use: Yes    Alcohol/week: 2.0 standard drinks of alcohol    Types: 1 Glasses of wine, 1 Cans of beer per week    Comment: socially   Drug use: Never   Sexual activity: Yes    Partners: Male    Birth control/protection: Surgical  Other Topics Concern   Not on file  Social History Narrative   Not on file   Social Drivers of Health   Financial Resource Strain: Low Risk  (01/24/2022)   Overall Financial Resource Strain (CARDIA)    Difficulty of Paying Living Expenses: Not hard at all  Food Insecurity: No Food Insecurity (01/24/2022)   Hunger Vital Sign    Worried About Running Out of Food in the Last Year: Never true    Ran Out of Food in the Last Year: Never true  Transportation Needs: No Transportation Needs (01/24/2022)  PRAPARE - Administrator, Civil Service (Medical): No    Lack of Transportation (Non-Medical): No  Physical Activity: Sufficiently Active (01/24/2022)   Exercise Vital Sign    Days of Exercise per Week: 5 days    Minutes of Exercise per Session: 40 min  Stress: No Stress Concern Present (01/24/2022)   [employer] of Occupational Health - Occupational Stress Questionnaire    Feeling of Stress : Not at all  Social Connections: Moderately Integrated (01/30/2023)   Social Connection and Isolation Panel    Frequency of Communication with Friends and Family: More than three times a week    Frequency of Social Gatherings with Friends and Family: More than three times a week    Attends Religious Services: More than 4 times per year    Active Member of [employer] or Organizations: No     Attends Banker Meetings: Never    Marital Status: Married   No past medical history on file. Past Surgical History:  Procedure Laterality Date   BREAST LUMPECTOMY Right 10/2020    Family History  Problem Relation Age of Onset   Breast cancer Mother 72   Diabetes Father    DiGeorge syndrome Child     Review of Systems   Objective:  There were no vitals taken for this visit.     01/30/2023    8:51 AM 01/24/2022   10:47 AM 01/24/2022    9:39 AM  BP/Weight  Systolic BP 110 96 86  Diastolic BP 74 70 60  Wt. (Lbs) 190  175  BMI 30.67 kg/m2  27.41 kg/m2    Physical Exam  Lab Results  Component Value Date   WBC 6.2 01/30/2023   HGB 15.9 01/30/2023   HCT 47.5 (H) 01/30/2023   PLT 294 01/30/2023   GLUCOSE 84 01/30/2023   CHOL 210 (H) 01/30/2023   TRIG 74 01/30/2023   HDL 78 01/30/2023   LDLCALC 119 (H) 01/30/2023   ALT 15 01/30/2023   AST 17 01/30/2023   NA 137 01/30/2023   K 4.9 01/30/2023   CL 99 01/30/2023   CREATININE 0.82 01/30/2023   BUN 12 01/30/2023   CO2 21 01/30/2023   TSH 3.360 01/30/2023      Assessment & Plan:   Routine medical exam     There is no height or weight on file to calculate BMI.   These are the goals we discussed:  Goals   None      This is a list of the screening recommended for you and due dates:  Health Maintenance  Topic Date Due   Hepatitis B Vaccine (1 of 3 - 19+ 3-dose series) Never done   Pap with HPV screening  Never done   Colon Cancer Screening  Never done   Breast Cancer Screening  11/06/2023   Flu Shot  11/20/2023   COVID-19 Vaccine (6 - 2025-26 season) 12/21/2023   DTaP/Tdap/Td vaccine (2 - Td or Tdap) 11/14/2024   Hepatitis C Screening  Completed   HIV Screening  Completed   Pneumococcal Vaccine  Aged Out   HPV Vaccine  Aged Out   Meningitis B Vaccine  Aged Out     No orders of the defined types were placed in this encounter.   Follow-up: No follow-ups on file.  An After Visit  Summary was printed and given to the patient.  Valerie Free, MD [facility] [phone]

## 2024-02-02 ENCOUNTER — Ambulatory Visit: Admitting: Family Medicine

## 2024-02-02 ENCOUNTER — Encounter: Payer: BC Managed Care – PPO | Admitting: Family Medicine

## 2024-02-02 ENCOUNTER — Encounter: Payer: Self-pay | Admitting: Family Medicine

## 2024-02-02 VITALS — BP 108/60 | HR 91 | Temp 98.2°F | Ht 66.0 in | Wt 171.0 lb

## 2024-02-02 DIAGNOSIS — Z1231 Encounter for screening mammogram for malignant neoplasm of breast: Secondary | ICD-10-CM

## 2024-02-02 DIAGNOSIS — Z0000 Encounter for general adult medical examination without abnormal findings: Secondary | ICD-10-CM

## 2024-02-02 DIAGNOSIS — N926 Irregular menstruation, unspecified: Secondary | ICD-10-CM | POA: Diagnosis not present

## 2024-02-02 DIAGNOSIS — Z1211 Encounter for screening for malignant neoplasm of colon: Secondary | ICD-10-CM

## 2024-02-02 LAB — POCT URINE PREGNANCY: Preg Test, Ur: NEGATIVE

## 2024-02-02 NOTE — Assessment & Plan Note (Signed)
 Neg pregnancy test Check hormones for menopause.  Orders:   POCT urine pregnancy   FSH/LH

## 2024-02-02 NOTE — Assessment & Plan Note (Signed)
 Routine wellness visit. Discussed health and safety measures. No concerns about depression or alcohol use. - Continue routine dental visits every 6 months. - Continue annual eye exams. - Maintain current safety measures at home. Orders:   TSH   Lipid panel   Comprehensive metabolic panel   CBC with Differential/Platelet

## 2024-02-02 NOTE — Patient Instructions (Signed)
  VISIT SUMMARY: During your visit, we addressed the infection in your left great toe, your recent menstrual irregularities, recurrent migraines with visual aura, and mild plantar fasciitis. We also conducted a routine wellness check and reviewed your vaccination status.  YOUR PLAN: LEFT GREAT TOE INFECTION: You had an infection in your left great toe that required drainage and a course of antibiotics. It has improved but still shows slight redness. -Continue to monitor the toe for any signs of worsening infection, such as increased redness, swelling, or pain.  MIGRAINE WITH VISUAL AURA: You have been experiencing recurrent migraines with visual disturbances, which are relieved by over-the-counter medications. -Continue using ibuprofen and Tylenol as needed for migraine relief.  MENSTRUAL IRREGULARITY, POSSIBLE PERIMENOPAUSE: You have not had a menstrual period since August 29 and are experiencing hot flashes. This may be a sign of perimenopause. -We will perform a urine pregnancy test. It was negative.  -If menstrual irregularity continues, call gynecologist.  ADULT WELLNESS VISIT: This was a routine wellness visit where we discussed your overall health and safety measures. -Continue routine dental visits every 6 months. -Continue annual eye exams. -Maintain current safety measures at home.  GENERAL HEALTH MAINTENANCE: We reviewed your vaccination status and discussed the flu shot  You also had some wax in your left ear which I forgot to mention. You could use some debrox drops otc for a week and see if you are able to alleviate the wax.                    Contains text generated by [employer].                                 Contains text generated by [employer]bridge.

## 2024-02-03 ENCOUNTER — Ambulatory Visit: Payer: Self-pay | Admitting: Family Medicine

## 2024-02-03 LAB — COMPREHENSIVE METABOLIC PANEL WITH GFR
ALT: 11 IU/L (ref 0–32)
AST: 15 IU/L (ref 0–40)
Albumin: 4.6 g/dL (ref 3.9–4.9)
Alkaline Phosphatase: 70 IU/L (ref 41–116)
BUN/Creatinine Ratio: 15 (ref 9–23)
BUN: 13 mg/dL (ref 6–24)
Bilirubin Total: 0.6 mg/dL (ref 0.0–1.2)
CO2: 24 mmol/L (ref 20–29)
Calcium: 9.7 mg/dL (ref 8.7–10.2)
Chloride: 100 mmol/L (ref 96–106)
Creatinine, Ser: 0.87 mg/dL (ref 0.57–1.00)
Globulin, Total: 2.4 g/dL (ref 1.5–4.5)
Glucose: 88 mg/dL (ref 70–99)
Potassium: 4.4 mmol/L (ref 3.5–5.2)
Sodium: 139 mmol/L (ref 134–144)
Total Protein: 7 g/dL (ref 6.0–8.5)
eGFR: 83 mL/min/1.73 (ref >59)

## 2024-02-03 LAB — CBC WITH DIFFERENTIAL/PLATELET
Basophils Absolute: 0 x10E3/uL (ref 0.0–0.2)
Basos: 1 %
EOS (ABSOLUTE): 0.1 x10E3/uL (ref 0.0–0.4)
Eos: 2 %
Hematocrit: 45.6 % (ref 34.0–46.6)
Hemoglobin: 15.1 g/dL (ref 11.1–15.9)
Immature Grans (Abs): 0 x10E3/uL (ref 0.0–0.1)
Immature Granulocytes: 0 %
Lymphocytes Absolute: 1 x10E3/uL (ref 0.7–3.1)
Lymphs: 17 %
MCH: 32.1 pg (ref 26.6–33.0)
MCHC: 33.1 g/dL (ref 31.5–35.7)
MCV: 97 fL (ref 79–97)
Monocytes Absolute: 0.6 x10E3/uL (ref 0.1–0.9)
Monocytes: 10 %
Neutrophils Absolute: 4.4 x10E3/uL (ref 1.4–7.0)
Neutrophils: 70 %
Platelets: 267 x10E3/uL (ref 150–450)
RBC: 4.71 x10E6/uL (ref 3.77–5.28)
RDW: 12.1 % (ref 11.7–15.4)
WBC: 6.2 x10E3/uL (ref 3.4–10.8)

## 2024-02-03 LAB — FSH/LH
FSH: 8.1 m[IU]/mL
LH: 8.5 m[IU]/mL

## 2024-02-03 LAB — LIPID PANEL
Chol/HDL Ratio: 2.2 ratio (ref 0.0–4.4)
Cholesterol, Total: 142 mg/dL (ref 100–199)
HDL: 66 mg/dL (ref >39)
LDL Chol Calc (NIH): 60 mg/dL (ref 0–99)
Triglycerides: 83 mg/dL (ref 0–149)
VLDL Cholesterol Cal: 16 mg/dL (ref 5–40)

## 2024-02-03 LAB — TSH: TSH: 2.56 u[IU]/mL (ref 0.450–4.500)

## 2024-03-02 ENCOUNTER — Ambulatory Visit: Admitting: Family Medicine

## 2024-03-29 ENCOUNTER — Other Ambulatory Visit: Payer: Self-pay | Admitting: Obstetrics and Gynecology

## 2024-03-29 DIAGNOSIS — Z1231 Encounter for screening mammogram for malignant neoplasm of breast: Secondary | ICD-10-CM

## 2024-04-05 ENCOUNTER — Encounter: Admitting: Family Medicine

## 2024-04-13 ENCOUNTER — Other Ambulatory Visit: Payer: Self-pay

## 2024-04-29 ENCOUNTER — Other Ambulatory Visit: Payer: Self-pay | Admitting: Medical Genetics

## 2024-04-29 ENCOUNTER — Ambulatory Visit

## 2024-05-06 ENCOUNTER — Ambulatory Visit
Admission: RE | Admit: 2024-05-06 | Discharge: 2024-05-06 | Disposition: A | Discharge status: Home / Self Care | Source: Ambulatory Visit | Attending: Obstetrics and Gynecology | Admitting: Obstetrics and Gynecology

## 2024-05-06 DIAGNOSIS — Z1231 Encounter for screening mammogram for malignant neoplasm of breast: Secondary | ICD-10-CM

## 2024-05-10 ENCOUNTER — Ambulatory Visit: Payer: Self-pay | Admitting: Family Medicine

## 2024-05-12 ENCOUNTER — Other Ambulatory Visit: Payer: Self-pay | Admitting: Obstetrics and Gynecology

## 2024-05-12 DIAGNOSIS — Z803 Family history of malignant neoplasm of breast: Secondary | ICD-10-CM

## 2024-06-10 ENCOUNTER — Other Ambulatory Visit (HOSPITAL_COMMUNITY): Payer: Self-pay

## 2024-06-12 ENCOUNTER — Other Ambulatory Visit

## 2025-02-02 ENCOUNTER — Encounter: Admitting: Family Medicine
